# Patient Record
Sex: Female | Born: 1955 | Smoking: Former smoker
Health system: Southern US, Community
[De-identification: ages and names within clinical notes are randomized; demographics above are authoritative.]

## PROBLEM LIST (undated history)

## (undated) DIAGNOSIS — J449 Chronic obstructive pulmonary disease, unspecified: Secondary | ICD-10-CM

## (undated) HISTORY — PX: CHOLECYSTECTOMY: SHX55

---

## 2018-05-03 ENCOUNTER — Encounter (HOSPITAL_COMMUNITY): Payer: Self-pay | Admitting: Internal Medicine

## 2018-05-03 ENCOUNTER — Inpatient Hospital Stay (HOSPITAL_COMMUNITY)
Admission: AD | Admit: 2018-05-03 | Discharge: 2018-05-07 | DRG: 492 | Disposition: A | Discharge status: Skilled Nursing Facility | Payer: Medicare PPO | Source: Other Acute Inpatient Hospital | Attending: Internal Medicine | Admitting: Internal Medicine

## 2018-05-03 ENCOUNTER — Other Ambulatory Visit: Payer: Self-pay | Admitting: Orthopedic Surgery

## 2018-05-03 DIAGNOSIS — Z419 Encounter for procedure for purposes other than remedying health state, unspecified: Secondary | ICD-10-CM

## 2018-05-03 DIAGNOSIS — W19XXXA Unspecified fall, initial encounter: Secondary | ICD-10-CM | POA: Diagnosis not present

## 2018-05-03 DIAGNOSIS — S92341A Displaced fracture of fourth metatarsal bone, right foot, initial encounter for closed fracture: Secondary | ICD-10-CM | POA: Diagnosis present

## 2018-05-03 DIAGNOSIS — G4700 Insomnia, unspecified: Secondary | ICD-10-CM | POA: Diagnosis present

## 2018-05-03 DIAGNOSIS — F419 Anxiety disorder, unspecified: Secondary | ICD-10-CM | POA: Diagnosis present

## 2018-05-03 DIAGNOSIS — G9341 Metabolic encephalopathy: Secondary | ICD-10-CM | POA: Diagnosis present

## 2018-05-03 DIAGNOSIS — R0902 Hypoxemia: Secondary | ICD-10-CM | POA: Diagnosis not present

## 2018-05-03 DIAGNOSIS — S82402A Unspecified fracture of shaft of left fibula, initial encounter for closed fracture: Secondary | ICD-10-CM

## 2018-05-03 DIAGNOSIS — W1811XA Fall from or off toilet without subsequent striking against object, initial encounter: Secondary | ICD-10-CM | POA: Diagnosis present

## 2018-05-03 DIAGNOSIS — J431 Panlobular emphysema: Secondary | ICD-10-CM | POA: Diagnosis not present

## 2018-05-03 DIAGNOSIS — J441 Chronic obstructive pulmonary disease with (acute) exacerbation: Secondary | ICD-10-CM | POA: Diagnosis present

## 2018-05-03 DIAGNOSIS — Z823 Family history of stroke: Secondary | ICD-10-CM | POA: Diagnosis not present

## 2018-05-03 DIAGNOSIS — S82302A Unspecified fracture of lower end of left tibia, initial encounter for closed fracture: Secondary | ICD-10-CM | POA: Diagnosis present

## 2018-05-03 DIAGNOSIS — F329 Major depressive disorder, single episode, unspecified: Secondary | ICD-10-CM | POA: Diagnosis present

## 2018-05-03 DIAGNOSIS — S82252A Displaced comminuted fracture of shaft of left tibia, initial encounter for closed fracture: Principal | ICD-10-CM | POA: Diagnosis present

## 2018-05-03 DIAGNOSIS — J9621 Acute and chronic respiratory failure with hypoxia: Secondary | ICD-10-CM | POA: Diagnosis present

## 2018-05-03 DIAGNOSIS — J449 Chronic obstructive pulmonary disease, unspecified: Secondary | ICD-10-CM | POA: Diagnosis present

## 2018-05-03 DIAGNOSIS — E039 Hypothyroidism, unspecified: Secondary | ICD-10-CM | POA: Diagnosis present

## 2018-05-03 DIAGNOSIS — S82202A Unspecified fracture of shaft of left tibia, initial encounter for closed fracture: Secondary | ICD-10-CM | POA: Diagnosis not present

## 2018-05-03 DIAGNOSIS — S82452A Displaced comminuted fracture of shaft of left fibula, initial encounter for closed fracture: Secondary | ICD-10-CM | POA: Diagnosis present

## 2018-05-03 DIAGNOSIS — Z87891 Personal history of nicotine dependence: Secondary | ICD-10-CM

## 2018-05-03 DIAGNOSIS — D62 Acute posthemorrhagic anemia: Secondary | ICD-10-CM | POA: Diagnosis present

## 2018-05-03 DIAGNOSIS — R52 Pain, unspecified: Secondary | ICD-10-CM

## 2018-05-03 DIAGNOSIS — M79671 Pain in right foot: Secondary | ICD-10-CM

## 2018-05-03 HISTORY — DX: Chronic obstructive pulmonary disease, unspecified: J44.9

## 2018-05-03 LAB — PROTIME-INR
INR: 1.01
PROTHROMBIN TIME: 13.2 s (ref 11.4–15.2)

## 2018-05-03 LAB — TYPE AND SCREEN
ABO/RH(D): AB POS
Antibody Screen: NEGATIVE

## 2018-05-03 LAB — APTT: APTT: 32 s (ref 24–36)

## 2018-05-03 LAB — ABO/RH: ABO/RH(D): AB POS

## 2018-05-03 MED ORDER — IPRATROPIUM-ALBUTEROL 0.5-2.5 (3) MG/3ML IN SOLN
3.0000 mL | Freq: Four times a day (QID) | RESPIRATORY_TRACT | Status: DC
  Administered 2018-05-04 – 2018-05-05 (×4): 3 mL via RESPIRATORY_TRACT
  Filled 2018-05-03 (×5): qty 3

## 2018-05-03 MED ORDER — OXYCODONE-ACETAMINOPHEN 5-325 MG PO TABS
1.0000 | ORAL_TABLET | Freq: Four times a day (QID) | ORAL | Status: DC | PRN
  Administered 2018-05-03 – 2018-05-04 (×2): 1 via ORAL
  Filled 2018-05-03 (×2): qty 1

## 2018-05-03 MED ORDER — METHYLPREDNISOLONE SODIUM SUCC 125 MG IJ SOLR
60.0000 mg | Freq: Three times a day (TID) | INTRAMUSCULAR | Status: DC
  Administered 2018-05-03: 60 mg via INTRAVENOUS
  Filled 2018-05-03: qty 2

## 2018-05-03 MED ORDER — MORPHINE SULFATE (PF) 2 MG/ML IV SOLN
0.5000 mg | INTRAVENOUS | Status: DC | PRN
  Administered 2018-05-04 (×2): 0.5 mg via INTRAVENOUS
  Filled 2018-05-03 (×2): qty 1

## 2018-05-03 MED ORDER — ALBUTEROL SULFATE (2.5 MG/3ML) 0.083% IN NEBU
2.5000 mg | INHALATION_SOLUTION | RESPIRATORY_TRACT | Status: DC | PRN
  Administered 2018-05-06 (×2): 2.5 mg via RESPIRATORY_TRACT
  Filled 2018-05-03 (×2): qty 3

## 2018-05-03 MED ORDER — IPRATROPIUM-ALBUTEROL 0.5-2.5 (3) MG/3ML IN SOLN
3.0000 mL | RESPIRATORY_TRACT | Status: DC
  Administered 2018-05-03: 3 mL via RESPIRATORY_TRACT
  Filled 2018-05-03: qty 3

## 2018-05-03 MED ORDER — SENNOSIDES-DOCUSATE SODIUM 8.6-50 MG PO TABS: 1.0000 | ORAL_TABLET | Freq: Every evening | ORAL | Status: DC | PRN

## 2018-05-03 MED ORDER — METHOCARBAMOL 500 MG PO TABS
500.0000 mg | ORAL_TABLET | Freq: Three times a day (TID) | ORAL | Status: DC | PRN
  Administered 2018-05-04 – 2018-05-07 (×4): 500 mg via ORAL
  Filled 2018-05-03 (×4): qty 1

## 2018-05-03 MED ORDER — DM-GUAIFENESIN ER 30-600 MG PO TB12
1.0000 | ORAL_TABLET | Freq: Two times a day (BID) | ORAL | Status: DC | PRN
  Administered 2018-05-07: 1 via ORAL
  Filled 2018-05-03 (×2): qty 1

## 2018-05-03 NOTE — H&P (Signed)
History and Physical    Rhonda Martinez UJW:[id number] DOB: 01-21-1956 DOA: 05/03/2018  Referring MD/NP/PA:   PCP: No primary care provider on file.   Patient coming from:  The patient is coming from home.  At baseline, pt is independent for most of ADL.   Chief Complaint: fall and left leg pain  HPI: Rhonda Martinez is a 62 y.o. female with medical history significant of COPD, former smoker, who presents with fall and left leg pain.  Patient is transfered from [facility] due to fracture of left distal tibia and proximal fibular diaphysis.   Pt states that she accidentally slipped and fell off the toilet in the early AM, developed severe pain in left lowe leg. Pt was initially seen in the ED of [facility]. Images showed mildly comminuted and minimally displaced proximal fibular diaphysis fracture with mild apex lateral angulation and lateral displacement, and comminuted angulated fracture of the distal tibia. Pt was evaluated by orthopedic surgeon, Dr. Ulice Brilliant there, and he recommendedpatient would benefit to be evaluated by orthopedic at a bigger center or this could also be done outpatient.   In the mean time, pt was noted to have confusion and drowssiness. She was also found to be hypoxia likely secondary to getting IV pain medications including 4 mg of IV morphine 1 mg of IV Dilaudid. Ct-head and MRI of brain were done there, which are all negative for acute intracranial issues. ABG showed pH 7.333, PCO2 51 and PO2 51. Pt was put on splint.  X-ray of her abdomen showed no evidence of bowel obstruction or ileus.  Chest x-ray showed poor inspiration with minimal patchy and linear atelectasis at the right lung base and stable mild chronic interstitial lung disease.  Patient was evaluate by neurology who thought this was either concussion related or metabolic encephalopathy. Dr Phylis Bougie (ED physician) consulted ortho, Dr Tinnie Gens of [facility] hospital.  When I saw pt on the floor, patient is  drowsy, but alert, oriented x3.  She states that she has a severe pain in the left lower leg, which is constant, sharp, 10 out of 10 in severity.  She said she did not have head injury or loss of consciousness when she fell.  She states that she has dry cough, but does not feel shortness of breath.  No chest pain, fever or chills.  No nausea, vomiting, diarrhea, abdominal pain.  Denies symptoms of UTI or unilateral weakness.  No facial droop or slurred speech.  Lbs: WBC 8.0, hemoglobin 12.9, platelets 155, electrolytes renal function okay, lactic acid 1.1, procalcitonin less than 0.05. Negative UA.   Review of Systems:   General: no fevers, chills, no body weight gain, has fatigue HEENT: no blurry vision, hearing changes or sore throat Respiratory: no dyspnea, has coughing, wheezing CV: no chest pain, no palpitations GI: no nausea, vomiting, abdominal pain, diarrhea, constipation GU: no dysuria, burning on urination, increased urinary frequency, hematuria  Ext: no leg edema. Neuro: no unilateral weakness, numbness, or tingling, no vision change or hearing loss. Has confusion and fall Skin: no rash, no skin tear. MSK: has pain in left leg Heme: No easy bruising.  Travel history: No recent long distant travel.  Allergy: Allergies not on file  Past Medical History:  Diagnosis Date  . COPD (chronic obstructive pulmonary disease) (HCC)     Past Surgical History:  Procedure Laterality Date  . CHOLECYSTECTOMY      Social History:  reports that she has quit smoking. She has never used  smokeless tobacco. She reports that she does not drink alcohol or use drugs.  Family History:  Family History  Problem Relation Age of Onset  . Stroke Father      Prior to Admission medications   Not on File    Physical Exam: Vitals:   05/03/18 1932 05/03/18 2040  BP: 139/72   Pulse: 87   Resp: 16   Temp: 98.2 F (36.8 C)   TempSrc: Oral   SpO2: 95% 95%   General: Not in acute  distress HEENT:       Eyes: PERRL, EOMI, no scleral icterus.       ENT: No discharge from the ears and nose, no pharynx injection, no tonsillar enlargement.        Neck: No JVD, no bruit, no mass felt. Heme: No neck lymph node enlargement. Cardiac: S1/S2, RRR, No murmurs, No gallops or rubs. Respiratory: has wheezing bilaterally.   GI: Soft, nondistended, nontender, no rebound pain, no organomegaly, BS present. GU: No hematuria Ext: No pitting leg edema bilaterally. 2+DP/PT pulse bilaterally. Musculoskeletal: pt's left leg is wrapped up (s/p of splint placement per report), has tenderness in the whole left leg. Skin: No rashes.  Neuro: Alert, drowsy, but oriented X3, cranial nerves II-XII grossly intact, moves all extremities. Psych: Patient is not psychotic, no suicidal or hemocidal ideation.  Labs on Admission: I have personally reviewed following labs and imaging studies  CBC: No results for input(s): WBC, NEUTROABS, HGB, HCT, MCV, PLT in the last 168 hours. Basic Metabolic Panel: No results for input(s): NA, K, CL, CO2, GLUCOSE, BUN, CREATININE, CALCIUM, MG, PHOS in the last 168 hours. GFR: CrCl cannot be calculated (No successful lab value found.). Liver Function Tests: No results for input(s): AST, ALT, ALKPHOS, BILITOT, PROT, ALBUMIN in the last 168 hours. No results for input(s): LIPASE, AMYLASE in the last 168 hours. No results for input(s): AMMONIA in the last 168 hours. Coagulation Profile: Recent Labs  Lab 05/03/18 1951  INR 1.01   Cardiac Enzymes: No results for input(s): CKTOTAL, CKMB, CKMBINDEX, TROPONINI in the last 168 hours. BNP (last 3 results) No results for input(s): PROBNP in the last 8760 hours. HbA1C: No results for input(s): HGBA1C in the last 72 hours. CBG: No results for input(s): GLUCAP in the last 168 hours. Lipid Profile: No results for input(s): CHOL, HDL, LDLCALC, TRIG, CHOLHDL, LDLDIRECT in the last 72 hours. Thyroid Function Tests: No  results for input(s): TSH, T4TOTAL, FREET4, T3FREE, THYROIDAB in the last 72 hours. Anemia Panel: No results for input(s): VITAMINB12, FOLATE, FERRITIN, TIBC, IRON, RETICCTPCT in the last 72 hours. Urine analysis: No results found for: COLORURINE, APPEARANCEUR, LABSPEC, PHURINE, GLUCOSEU, HGBUR, BILIRUBINUR, KETONESUR, PROTEINUR, UROBILINOGEN, NITRITE, LEUKOCYTESUR Sepsis Labs: @LABRCNTIP (procalcitonin:4,lacticidven:4) )No results found for this or any previous visit (from the past 240 hour(s)).   Radiological Exams on Admission: No results found.   EKG:  Not done in ED, will get one.   Assessment/Plan Principal Problem:   Closed fracture of left fibula and tibia Active Problems:   Closed fracture of left distal tibia   Fall   COPD exacerbation (HCC)   Acute on chronic respiratory failure with hypoxia (HCC)   Acute metabolic encephalopathy   Closed fracture of left fibula and tibia: EDP in [facility] consulted Dr. Leotis Shames of ortho. At pt's arrival, RN informed ortho of pt's arrival. Per RN, Dr. Evonnie Dawes is going to do the surgery in early AM. Pt still has severe pain. Due to drowsiness, will carefully use narcotics,  particularly IV narcotics.  - will admit to Med-surg bed as inpt - Pain control: low dose morphine (0.5 mg q3h) prn and prn percocet - When necessary Zofran for nausea - Robaxin for muscle spasm - type and cross - INR/PTT - PT/OT when able to (not ordered now) - NPO after MN  Fall: seems to be a mechanica fall. MRI-brain and CT-head negative.  -pt/ot when able to   Acute on chronic respiratory failure with hypoxia and COPD exacerbation: Patient had hypoxia.  Currently oxygen saturation 95% on 2 L nasal cannula oxygen. She has wheezing on auscultation, indicating COPD exacerbation.  No productive cough, fever or leukocytosis, does not need antibiotics. -Nebulizers: scheduled Duoneb and prn albuterol -Solu-Medrol 60 mg IV tid -Mucinex for cough  -Incentive  spirometry -Nasal cannula oxygen as needed to maintain O2 saturation 92% or greater  Acute metabolic encephalopathy: Etiology is not clear.  CT head and MRI for brain negative for acute intracranial abnormalities. patient was evaluate by neurology who thought this was either concussion related or metabolic encephalopathy.  Other differential diagnosis includes hypoxia, delirium and severe pain.  No focal neurologic findings on physical examination.  Urinalysis was negative. -Frequent neuro check -Pain control.   DVT ppx: SCD Code Status: Full code Family Communication: None at bed side.      Disposition Plan:  To be determined, possible rehab Consults called:  Ortho, Dr. Leotis Shames Admission status:   Inpatient/tele      Date of Service 05/03/2018    Lorretta Harp [facility] Pager [phone]  If 7PM-7AM, please contact night-coverage [email or web address] Password [facility] 05/03/2018, 9:42 PM

## 2018-05-03 NOTE — Plan of Care (Signed)
  Problem: Education: Goal: Knowledge of General Education information will improve Description: Including pain rating scale, medication(s)/side effects and non-pharmacologic comfort measures Outcome: Progressing   Problem: Pain Managment: Goal: General experience of comfort will improve Outcome: Progressing   Problem: Safety: Goal: Ability to remain free from injury will improve Outcome: Progressing   

## 2018-05-03 NOTE — Progress Notes (Addendum)
Patient admitted from [facility] through [place]. Pt A&O x3 (person, place, situation.) LLE with short leg splint dry & intact, left toes warm and mobile.  Came in on 4L oxygen nasal cannula and is currently on 3L with O2 sats at 93. No shortness of breath & no chest pain.

## 2018-05-04 ENCOUNTER — Encounter (HOSPITAL_COMMUNITY): Payer: Self-pay | Admitting: Certified Registered Nurse Anesthetist

## 2018-05-04 ENCOUNTER — Encounter (HOSPITAL_COMMUNITY)
Admission: AD | Disposition: A | Discharge status: Skilled Nursing Facility | Payer: Self-pay | Source: Other Acute Inpatient Hospital | Attending: Nephrology

## 2018-05-04 ENCOUNTER — Inpatient Hospital Stay (HOSPITAL_COMMUNITY): Payer: Medicare PPO | Admitting: Anesthesiology

## 2018-05-04 ENCOUNTER — Inpatient Hospital Stay (HOSPITAL_COMMUNITY): Admission: RE | Admit: 2018-05-04 | Payer: Medicare PPO | Source: Ambulatory Visit | Admitting: Orthopedic Surgery

## 2018-05-04 ENCOUNTER — Inpatient Hospital Stay (HOSPITAL_COMMUNITY): Payer: Medicare PPO

## 2018-05-04 DIAGNOSIS — Z419 Encounter for procedure for purposes other than remedying health state, unspecified: Secondary | ICD-10-CM

## 2018-05-04 HISTORY — PX: TIBIA IM NAIL INSERTION: SHX2516

## 2018-05-04 LAB — HIV ANTIBODY (ROUTINE TESTING W REFLEX): HIV Screen 4th Generation wRfx: NONREACTIVE

## 2018-05-04 LAB — BASIC METABOLIC PANEL
Anion gap: 9 (ref 5–15)
BUN: 9 mg/dL (ref 8–23)
CO2: 25 mmol/L (ref 22–32)
Calcium: 9 mg/dL (ref 8.9–10.3)
Chloride: 103 mmol/L (ref 98–111)
Creatinine, Ser: 0.97 mg/dL (ref 0.44–1.00)
GFR calc Af Amer: >60 mL/min (ref >60)
GLUCOSE: 146 mg/dL — AB (ref 70–99)
POTASSIUM: 4.3 mmol/L (ref 3.5–5.1)
Sodium: 137 mmol/L (ref 135–145)

## 2018-05-04 LAB — CBC
HCT: 35.2 % — ABNORMAL LOW (ref 36.0–46.0)
Hemoglobin: 11.3 g/dL — ABNORMAL LOW (ref 12.0–15.0)
MCH: 32.6 pg (ref 26.0–34.0)
MCHC: 32.1 g/dL (ref 30.0–36.0)
MCV: 101.4 fL — AB (ref 78.0–100.0)
PLATELETS: 174 10*3/uL (ref 150–400)
RBC: 3.47 MIL/uL — AB (ref 3.87–5.11)
RDW: 14.5 % (ref 11.5–15.5)
WBC: 9.3 10*3/uL (ref 4.0–10.5)

## 2018-05-04 LAB — SURGICAL PCR SCREEN
MRSA, PCR: NEGATIVE
Staphylococcus aureus: NEGATIVE

## 2018-05-04 SURGERY — INSERTION, INTRAMEDULLARY ROD, TIBIA
Anesthesia: General | Laterality: Left

## 2018-05-04 MED ORDER — FENTANYL CITRATE (PF) 250 MCG/5ML IJ SOLN
INTRAMUSCULAR | Status: AC
  Filled 2018-05-04: qty 5

## 2018-05-04 MED ORDER — ROCURONIUM BROMIDE 50 MG/5ML IV SOSY
PREFILLED_SYRINGE | INTRAVENOUS | Status: DC | PRN
  Administered 2018-05-04: 25 mg via INTRAVENOUS
  Administered 2018-05-04: 50 mg via INTRAVENOUS
  Administered 2018-05-04: 10 mg via INTRAVENOUS

## 2018-05-04 MED ORDER — ADULT MULTIVITAMIN W/MINERALS CH
1.0000 | ORAL_TABLET | Freq: Every day | ORAL | Status: DC
  Administered 2018-05-05 – 2018-05-07 (×3): 1 via ORAL
  Filled 2018-05-04 (×3): qty 1

## 2018-05-04 MED ORDER — FENTANYL CITRATE (PF) 100 MCG/2ML IJ SOLN
INTRAMUSCULAR | Status: DC | PRN
  Administered 2018-05-04 (×7): 50 ug via INTRAVENOUS

## 2018-05-04 MED ORDER — SUGAMMADEX SODIUM 200 MG/2ML IV SOLN
INTRAVENOUS | Status: DC | PRN
  Administered 2018-05-04: 200 mg via INTRAVENOUS

## 2018-05-04 MED ORDER — PHENYLEPHRINE 40 MCG/ML (10ML) SYRINGE FOR IV PUSH (FOR BLOOD PRESSURE SUPPORT)
PREFILLED_SYRINGE | INTRAVENOUS | Status: AC
  Filled 2018-05-04: qty 10

## 2018-05-04 MED ORDER — PROMETHAZINE HCL 25 MG/ML IJ SOLN: 6.2500 mg | INTRAMUSCULAR | Status: DC | PRN

## 2018-05-04 MED ORDER — ENSURE ENLIVE PO LIQD
237.0000 mL | Freq: Two times a day (BID) | ORAL | Status: DC
  Administered 2018-05-05 – 2018-05-07 (×6): 237 mL via ORAL

## 2018-05-04 MED ORDER — ONDANSETRON HCL 4 MG/2ML IJ SOLN
INTRAMUSCULAR | Status: AC
  Filled 2018-05-04: qty 2

## 2018-05-04 MED ORDER — PROPOFOL 10 MG/ML IV BOLUS
INTRAVENOUS | Status: DC | PRN
  Administered 2018-05-04: 100 mg via INTRAVENOUS

## 2018-05-04 MED ORDER — HYDROMORPHONE HCL 1 MG/ML IJ SOLN
INTRAMUSCULAR | Status: AC
  Administered 2018-05-04: 0.5 mg via INTRAVENOUS
  Filled 2018-05-04: qty 1

## 2018-05-04 MED ORDER — ENOXAPARIN SODIUM 40 MG/0.4ML ~~LOC~~ SOLN
40.0000 mg | SUBCUTANEOUS | Status: DC
  Administered 2018-05-05 – 2018-05-07 (×3): 40 mg via SUBCUTANEOUS
  Filled 2018-05-04 (×3): qty 0.4

## 2018-05-04 MED ORDER — LACTATED RINGERS IV SOLN
INTRAVENOUS | Status: DC | PRN
  Administered 2018-05-04 (×2): via INTRAVENOUS

## 2018-05-04 MED ORDER — ACETAMINOPHEN 10 MG/ML IV SOLN
INTRAVENOUS | Status: AC
  Filled 2018-05-04: qty 100

## 2018-05-04 MED ORDER — CEFAZOLIN SODIUM-DEXTROSE 2-3 GM-%(50ML) IV SOLR
INTRAVENOUS | Status: DC | PRN
  Administered 2018-05-04: 2 g via INTRAVENOUS

## 2018-05-04 MED ORDER — 0.9 % SODIUM CHLORIDE (POUR BTL) OPTIME
TOPICAL | Status: DC | PRN
  Administered 2018-05-04: 1000 mL

## 2018-05-04 MED ORDER — DULOXETINE HCL 60 MG PO CPEP
60.0000 mg | ORAL_CAPSULE | Freq: Two times a day (BID) | ORAL | Status: DC
  Administered 2018-05-05 – 2018-05-07 (×6): 60 mg via ORAL
  Filled 2018-05-04 (×7): qty 1

## 2018-05-04 MED ORDER — QUETIAPINE FUMARATE 400 MG PO TABS
200.0000 mg | ORAL_TABLET | Freq: Every day | ORAL | Status: DC
  Administered 2018-05-05 – 2018-05-07 (×3): 200 mg via ORAL
  Filled 2018-05-04 (×3): qty 1

## 2018-05-04 MED ORDER — ACETAMINOPHEN 10 MG/ML IV SOLN
INTRAVENOUS | Status: DC | PRN
  Administered 2018-05-04: 1000 mg via INTRAVENOUS

## 2018-05-04 MED ORDER — METHYLPREDNISOLONE SODIUM SUCC 125 MG IJ SOLR
40.0000 mg | Freq: Two times a day (BID) | INTRAMUSCULAR | Status: DC
  Administered 2018-05-04 – 2018-05-05 (×2): 40 mg via INTRAVENOUS
  Filled 2018-05-04 (×2): qty 2

## 2018-05-04 MED ORDER — DEXAMETHASONE SODIUM PHOSPHATE 10 MG/ML IJ SOLN
INTRAMUSCULAR | Status: DC | PRN
  Administered 2018-05-04: 10 mg via INTRAVENOUS

## 2018-05-04 MED ORDER — LIDOCAINE 2% (20 MG/ML) 5 ML SYRINGE
INTRAMUSCULAR | Status: DC | PRN
  Administered 2018-05-04: 60 mg via INTRAVENOUS

## 2018-05-04 MED ORDER — OXYCODONE-ACETAMINOPHEN 5-325 MG PO TABS
1.0000 | ORAL_TABLET | Freq: Four times a day (QID) | ORAL | Status: DC | PRN
  Administered 2018-05-04 – 2018-05-07 (×10): 2 via ORAL
  Filled 2018-05-04: qty 1
  Filled 2018-05-04 (×2): qty 2
  Filled 2018-05-04: qty 1
  Filled 2018-05-04: qty 2
  Filled 2018-05-04 (×2): qty 1
  Filled 2018-05-04 (×5): qty 2

## 2018-05-04 MED ORDER — ALBUTEROL SULFATE HFA 108 (90 BASE) MCG/ACT IN AERS
INHALATION_SPRAY | RESPIRATORY_TRACT | Status: DC | PRN
  Administered 2018-05-04: 2 via RESPIRATORY_TRACT
  Administered 2018-05-04: 4 via RESPIRATORY_TRACT

## 2018-05-04 MED ORDER — ONDANSETRON HCL 4 MG/2ML IJ SOLN
INTRAMUSCULAR | Status: DC | PRN
  Administered 2018-05-04: 4 mg via INTRAVENOUS

## 2018-05-04 MED ORDER — MIDAZOLAM HCL 5 MG/5ML IJ SOLN
INTRAMUSCULAR | Status: DC | PRN
  Administered 2018-05-04: 2 mg via INTRAVENOUS

## 2018-05-04 MED ORDER — MORPHINE SULFATE (PF) 2 MG/ML IV SOLN
1.0000 mg | INTRAVENOUS | Status: DC | PRN
  Administered 2018-05-04 – 2018-05-05 (×2): 1 mg via INTRAVENOUS
  Administered 2018-05-06: 2 mg via INTRAVENOUS
  Filled 2018-05-04 (×3): qty 1

## 2018-05-04 MED ORDER — QUETIAPINE FUMARATE 400 MG PO TABS
400.0000 mg | ORAL_TABLET | Freq: Every day | ORAL | Status: DC
  Administered 2018-05-04 – 2018-05-07 (×4): 400 mg via ORAL
  Filled 2018-05-04 (×4): qty 1

## 2018-05-04 MED ORDER — HYDROMORPHONE HCL 1 MG/ML IJ SOLN
0.2500 mg | INTRAMUSCULAR | Status: DC | PRN
  Administered 2018-05-04: 0.5 mg via INTRAVENOUS

## 2018-05-04 MED ORDER — MIDAZOLAM HCL 2 MG/2ML IJ SOLN
INTRAMUSCULAR | Status: AC
  Filled 2018-05-04: qty 2

## 2018-05-04 MED ORDER — LEVALBUTEROL HCL 1.25 MG/0.5ML IN NEBU
1.2500 mg | INHALATION_SOLUTION | Freq: Once | RESPIRATORY_TRACT | Status: AC
  Administered 2018-05-04: 1.25 mg via RESPIRATORY_TRACT
  Filled 2018-05-04: qty 0.5

## 2018-05-04 SURGICAL SUPPLY — 71 items
BANDAGE ACE 4X5 VEL STRL LF (GAUZE/BANDAGES/DRESSINGS) ×3 IMPLANT
BANDAGE ACE 6X5 VEL STRL LF (GAUZE/BANDAGES/DRESSINGS) ×3 IMPLANT
BANDAGE ESMARK 6X9 LF (GAUZE/BANDAGES/DRESSINGS) IMPLANT
BIT DRILL 4.3 CALIBRATED (DRILL) ×1 IMPLANT
BIT DRILL CALIBRTD FREE HND4.3 (BIT) ×1 IMPLANT
BLADE CLIPPER SURG (BLADE) IMPLANT
BLADE SURG 15 STRL LF DISP TIS (BLADE) ×1 IMPLANT
BLADE SURG 15 STRL SS (BLADE) ×2
BNDG COHESIVE 6X5 TAN STRL LF (GAUZE/BANDAGES/DRESSINGS) ×3 IMPLANT
BNDG ESMARK 6X9 LF (GAUZE/BANDAGES/DRESSINGS)
BNDG GAUZE ELAST 4 BULKY (GAUZE/BANDAGES/DRESSINGS) ×3 IMPLANT
BOOTCOVER CLEANROOM LRG (PROTECTIVE WEAR) ×6 IMPLANT
COVER SURGICAL LIGHT HANDLE (MISCELLANEOUS) ×6 IMPLANT
CUFF TOURNIQUET SINGLE 34IN LL (TOURNIQUET CUFF) IMPLANT
CUFF TOURNIQUET SINGLE 44IN (TOURNIQUET CUFF) IMPLANT
DRAPE C-ARM 42X72 X-RAY (DRAPES) ×3 IMPLANT
DRAPE HALF SHEET 40X57 (DRAPES) ×6 IMPLANT
DRAPE IMP U-DRAPE 54X76 (DRAPES) ×3 IMPLANT
DRAPE INCISE IOBAN 66X45 STRL (DRAPES) ×3 IMPLANT
DRAPE ORTHO SPLIT 77X108 STRL (DRAPES) ×6
DRAPE SURG ORHT 6 SPLT 77X108 (DRAPES) ×3 IMPLANT
DRAPE U-SHAPE 47X51 STRL (DRAPES) ×3 IMPLANT
DRILL 4.3 CALIBRATED (DRILL) ×3
DRILL CALIBRATED FREE HAND 4.3 (BIT) ×3
DRSG ADAPTIC 3X8 NADH LF (GAUZE/BANDAGES/DRESSINGS) ×3 IMPLANT
DRSG PAD ABDOMINAL 8X10 ST (GAUZE/BANDAGES/DRESSINGS) ×3 IMPLANT
DURAPREP 26ML APPLICATOR (WOUND CARE) ×3 IMPLANT
ELECT REM PT RETURN 9FT ADLT (ELECTROSURGICAL) ×3
ELECTRODE REM PT RTRN 9FT ADLT (ELECTROSURGICAL) ×1 IMPLANT
FACESHIELD WRAPAROUND (MASK) IMPLANT
GAUZE SPONGE 4X4 12PLY STRL (GAUZE/BANDAGES/DRESSINGS) ×3 IMPLANT
GAUZE XEROFORM 5X9 LF (GAUZE/BANDAGES/DRESSINGS) ×3 IMPLANT
GLOVE BIO SURGEON STRL SZ7.5 (GLOVE) ×3 IMPLANT
GLOVE BIOGEL PI IND STRL 8 (GLOVE) ×1 IMPLANT
GLOVE BIOGEL PI INDICATOR 8 (GLOVE) ×2
GLOVE BIOGEL PI ORTHO PRO SZ8 (GLOVE) ×2
GLOVE ORTHO TXT STRL SZ7.5 (GLOVE) ×3 IMPLANT
GLOVE PI ORTHO PRO STRL SZ8 (GLOVE) ×1 IMPLANT
GLOVE SURG ORTHO 8.0 STRL STRW (GLOVE) ×6 IMPLANT
GOWN STRL REUS W/ TWL LRG LVL3 (GOWN DISPOSABLE) IMPLANT
GOWN STRL REUS W/TWL LRG LVL3 (GOWN DISPOSABLE)
GUIDEWIRE BALL NOSE 100CM (WIRE) ×3 IMPLANT
KIT BASIN OR (CUSTOM PROCEDURE TRAY) ×3 IMPLANT
KIT TURNOVER KIT B (KITS) ×3 IMPLANT
MANIFOLD NEPTUNE II (INSTRUMENTS) ×3 IMPLANT
NAIL TIBIAL ZNN 10MMX32CM (Nail) ×3 IMPLANT
NS IRRIG 1000ML POUR BTL (IV SOLUTION) ×3 IMPLANT
PACK GENERAL/GYN (CUSTOM PROCEDURE TRAY) ×3 IMPLANT
PACK UNIVERSAL I (CUSTOM PROCEDURE TRAY) ×3 IMPLANT
PAD ARMBOARD 7.5X6 YLW CONV (MISCELLANEOUS) ×6 IMPLANT
PAD CAST 4YDX4 CTTN HI CHSV (CAST SUPPLIES) ×1 IMPLANT
PADDING CAST COTTON 4X4 STRL (CAST SUPPLIES) ×2
PIN GUIDE 3.0 THREADED (PIN) ×3 IMPLANT
SCREW BONE 5.0X35MM CORTICAL (Screw) ×3 IMPLANT
SCREW BONE 5.0X37.5MM CORT Z (Screw) ×3 IMPLANT
SCREW CANC FEM FA 5X30 (Screw) ×6 IMPLANT
SCREW HEX HEAD 3.5X42.5 (Screw) ×3 IMPLANT
STAPLER VISISTAT 35W (STAPLE) ×3 IMPLANT
STOCKINETTE IMPERVIOUS LG (DRAPES) ×3 IMPLANT
SUT ETHILON 2 0 FS 18 (SUTURE) ×3 IMPLANT
SUT MNCRL AB 3-0 PS2 18 (SUTURE) ×3 IMPLANT
SUT PDS AB 0 CT 36 (SUTURE) ×3 IMPLANT
SUT VIC AB 0 CT1 18XCR BRD 8 (SUTURE) ×1 IMPLANT
SUT VIC AB 0 CT1 8-18 (SUTURE) ×2
SUT VIC AB 2-0 CT1 27 (SUTURE) ×2
SUT VIC AB 2-0 CT1 TAPERPNT 27 (SUTURE) ×1 IMPLANT
SUT VIC AB 3-0 SH 8-18 (SUTURE) ×3 IMPLANT
TOWEL OR 17X24 6PK STRL BLUE (TOWEL DISPOSABLE) ×3 IMPLANT
TOWEL OR 17X26 10 PK STRL BLUE (TOWEL DISPOSABLE) ×3 IMPLANT
TRAY FOLEY MTR SLVR 16FR STAT (SET/KITS/TRAYS/PACK) IMPLANT
WATER STERILE IRR 1000ML POUR (IV SOLUTION) ×3 IMPLANT

## 2018-05-04 NOTE — Progress Notes (Signed)
Orthopedic Tech Progress Note Patient Details:  Rhonda Martinez 21-Dec-1955 [id number]  Ortho Devices Type of Ortho Device: CAM walker Ortho Device/Splint Location: or to apply Ortho Device/Splint Interventions: Casandra Doffing 05/04/2018, 10:13 AM

## 2018-05-04 NOTE — Transfer of Care (Signed)
Immediate Anesthesia Transfer of Care Note  Patient: Rhonda Martinez  Procedure(s) Performed: INTRAMEDULLARY (IM) NAIL TIBIAL (Left )  Patient Location: PACU  Anesthesia Type:General  Level of Consciousness: awake, alert  and oriented  Airway & Oxygen Therapy: Patient Spontanous Breathing and Patient connected to face mask oxygen  Post-op Assessment: Report given to RN and Post -op Vital signs reviewed and stable  Post vital signs: Reviewed and stable  Last Vitals:  Vitals Value Taken Time  BP 131/64 05/04/2018 10:30 AM  Temp    Pulse 110 05/04/2018 10:33 AM  Resp 17 05/04/2018 10:33 AM  SpO2 89 % 05/04/2018 10:33 AM  Vitals shown include unvalidated device data.  Last Pain:  Vitals:   05/04/18 0430  TempSrc: Oral  PainSc:          Complications: No apparent anesthesia complications

## 2018-05-04 NOTE — NC FL2 (Signed)
[place] MEDICAID [id number] LEVEL OF CARE SCREENING TOOL     IDENTIFICATION  Patient Name: Rhonda Martinez Birthdate: 05-29-1956 Sex: female Admission Date (Current Location): 05/03/2018  [facility] and [place] Number:  Producer, television/film/video and Address:  The [place], [address], [place], [place] [address]      Provider Number: [id number]  Attending Physician Name and Address:  Delano Metz, MD  Relative Name and Phone Number:  Cristal Deer son at [phone]    Current Level of Care: Hospital Recommended Level of Care: Skilled Nursing Facility Prior Approval Number:    Date Approved/Denied:   PASRR Number: [id number]  Discharge Plan: SNF    Current Diagnoses: Patient Active Problem List   Diagnosis Date Noted  . Surgery, elective   . Closed fracture of left distal tibia 05/03/2018  . Fall 05/03/2018  . COPD (chronic obstructive pulmonary disease) (HCC) 05/03/2018  . Acute on chronic respiratory failure with hypoxia (HCC) 05/03/2018  . Acute metabolic encephalopathy 05/03/2018  . Closed fracture of left fibula and tibia 05/03/2018    Orientation RESPIRATION BLADDER Height & Weight     Self, Time, Place  Normal Continent Weight: 210 lb 1.6 oz (95.3 kg)(from 03/22/18 encounter) Height:  5' 5.98" (167.6 cm)(from 03/22/18 encounter)  BEHAVIORAL SYMPTOMS/MOOD NEUROLOGICAL BOWEL NUTRITION STATUS      Continent Diet(see discharge summary)  AMBULATORY STATUS COMMUNICATION OF NEEDS Skin   Limited Assist Verbally Surgical wounds(left leg surgical incision)                       Personal Care Assistance Level of Assistance  Bathing, Feeding, Dressing, Total care Bathing Assistance: Limited assistance Feeding assistance: Independent Dressing Assistance: Limited assistance Total Care Assistance: Limited assistance   Functional Limitations Info  Sight, Hearing, Speech Sight Info: Adequate Hearing Info: Adequate Speech Info: Adequate    SPECIAL  CARE FACTORS FREQUENCY  PT (By licensed PT), OT (By licensed OT)     PT Frequency: 5x weekly OT Frequency: 5x weekly            Contractures Contractures Info: Not present    Additional Factors Info  Allergies   Allergies Info: no known           Current Medications (05/04/2018):  This is the current hospital active medication list Current Facility-Administered Medications  Medication Dose Route Frequency Provider Last Rate Last Dose  . albuterol (PROVENTIL) (2.5 MG/3ML) 0.083% nebulizer solution 2.5 mg  2.5 mg Nebulization Q4H PRN Terance Hart, MD      . dextromethorphan-guaiFENesin [facility] DM) 30-600 MG per 12 hr tablet 1 tablet  1 tablet Oral BID PRN Terance Hart, MD      . DULoxetine (CYMBALTA) DR capsule 60 mg  60 mg Oral BID Delano Metz, MD      . Melene Muller ON 05/05/2018] enoxaparin (LOVENOX) injection 40 mg  40 mg Subcutaneous Q24H Terance Hart, MD      . Melene Muller ON 05/05/2018] feeding supplement (ENSURE ENLIVE) (ENSURE ENLIVE) liquid 237 mL  237 mL Oral BID BM Delano Metz, MD      . ipratropium-albuterol (DUONEB) 0.5-2.5 (3) MG/3ML nebulizer solution 3 mL  3 mL Nebulization Q6H Terance Hart, MD   3 mL at 05/04/18 1355  . methocarbamol (ROBAXIN) tablet 500 mg  500 mg Oral Q8H PRN Terance Hart, MD      . methylPREDNISolone sodium succinate (SOLU-MEDROL) 125 mg/2 mL injection 40 mg  40 mg Intravenous  Q12H Delano Metz, MD      . morphine 2 MG/ML injection 1-2 mg  1-2 mg Intravenous Q4H PRN Delano Metz, MD   1 mg at 05/04/18 1543  . [START ON 05/05/2018] multivitamin with minerals tablet 1 tablet  1 tablet Oral Daily Delano Metz, MD      . oxyCODONE-acetaminophen (PERCOCET/ROXICET) 5-325 MG per tablet 1-2 tablet  1-2 tablet Oral Q6H PRN Delano Metz, MD      . Melene Muller ON 05/05/2018] QUEtiapine (SEROQUEL) tablet 200 mg  200 mg Oral Q breakfast Delano Metz, MD      . QUEtiapine (SEROQUEL) tablet 400 mg  400 mg Oral QHS  Delano Metz, MD      . senna-docusate (Senokot-S) tablet 1 tablet  1 tablet Oral QHS PRN Terance Hart, MD         Discharge Medications: Please see discharge summary for a list of discharge medications.  Relevant Imaging Results:  Relevant Lab Results:   Additional Information SSN: [id number]  Gildardo Griffes, LCSW

## 2018-05-04 NOTE — Progress Notes (Signed)
Patient reports she changed her mind and would like to go to SNF in [place] city. CSW asked the name of the facility and patient reported it was called [facility]. Then patient reported she was guessing, but this was her only choice. CSW encouraged her to choose 3 choices if [facility] does not have beds however patient reports this is her only option. CSW will research possible SNF placements in this area.   [place], [place] [phone]

## 2018-05-04 NOTE — Clinical Social Work Note (Signed)
Clinical Social Work Assessment  Patient Details  Name: Rhonda Martinez MRN: [id number] Date of Birth: 1956-06-05  Date of referral:  05/04/18               Reason for consult:  Care Management Concerns, Discharge Planning                Permission sought to share information with:  Case Manager, Facility Medical sales representative Permission granted to share information::  Yes, Verbal Permission Granted  Name::     No family permission to contact at this time  Agency::  SNFs  Relationship::     Contact Information:     Housing/Transportation Living arrangements for the past 2 months:  Single Family Home Source of Information:  Patient Patient Interpreter Needed:  None Criminal Activity/Legal Involvement Pertinent to Current Situation/Hospitalization:  No - Comment as needed Significant Relationships:  Adult Children Lives with:  Adult Children Do you feel safe going back to the place where you live?  No Need for family participation in patient care:  No (Coment)  Care giving concerns:  CSW received referral for possible SNF placement at time of discharge. Spoke with patient regarding possibility of SNF placement . Patient's  son  is currently unable to care for her at their home given patient's current needs and fall risk.  Patient expressed understanding of PT recommendation and is agreeable despite prior hesitations to SNF placement at time of discharge. CSW to continue to follow and assist with discharge planning needs.     Social Worker assessment / plan:  Spoke with patient concerning possibility of rehab at SNF before returning home. Patient denied wanting to go to SNF at first interaction, as she had reported she would go home to her son and her animals (cats and dogs). Patient then changed her mind and now agrees to SNF.   Employment status:  Retired Database administrator PT Recommendations:  Skilled Nursing Facility Information / Referral to community  resources:  Skilled Nursing Facility  Patient/Family's Response to care:  Patient recognizes need for rehab before returning home and are agreeable to a SNF in [place]. They report preference for       . CSW explained insurance authorization process.   Patient/Family's Understanding of and Emotional Response to Diagnosis, Current Treatment, and Prognosis:  Patient is realistic regarding therapy needs and expressed being hopeful for SNF placement. Patient expressed understanding of CSW role and discharge process as well as medical condition. No questions/concerns about plan or treatment.    Emotional Assessment Appearance:  Appears stated age Attitude/Demeanor/Rapport:  Irrational, Engaged, Inconsistent Affect (typically observed):  Apprehensive, Irritable Orientation:  Oriented to Self, Oriented to Place, Oriented to Situation, Oriented to  Time Alcohol / Substance use:  Not Applicable Psych involvement (Current and /or in the community):  No (Comment)  Discharge Needs  Concerns to be addressed:  Discharge Planning Concerns, Care Coordination Readmission within the last 30 days:  No Current discharge risk:  Dependent with Mobility Barriers to Discharge:  Continued Medical Work up   [employer], LCSW 05/04/2018, 4:14 PM

## 2018-05-04 NOTE — Anesthesia Postprocedure Evaluation (Signed)
Anesthesia Post Note  Patient: [name]  Procedure(s) Performed: INTRAMEDULLARY (IM) NAIL TIBIAL (Left )     Patient location during evaluation: PACU Anesthesia Type: General Level of consciousness: awake and alert Pain management: pain level controlled Vital Signs Assessment: post-procedure vital signs reviewed and stable Respiratory status: spontaneous breathing, nonlabored ventilation, respiratory function stable and patient connected to nasal cannula oxygen Cardiovascular status: blood pressure returned to baseline and stable Postop Assessment: no apparent nausea or vomiting Anesthetic complications: no    Last Vitals:  Vitals:   05/04/18 1130 05/04/18 1205  BP: (!) 147/96 132/77  Pulse: 93 85  Resp: (!) 9 16  Temp: (!) 36.2 C 36.9 C  SpO2: 91% 93%    Last Pain:  Vitals:   05/04/18 1205  TempSrc: Oral  PainSc:                  Thresia Ramanathan S

## 2018-05-04 NOTE — Anesthesia Procedure Notes (Addendum)
Procedure Name: Intubation Date/Time: 05/04/2018 7:42 AM Performed by: Inda Coke, CRNA Pre-anesthesia Checklist: Patient identified, Emergency Drugs available, Suction available and Patient being monitored Patient Re-evaluated:Patient Re-evaluated prior to induction Oxygen Delivery Method: Circle System Utilized Preoxygenation: Pre-oxygenation with 100% oxygen Induction Type: IV induction Ventilation: Mask ventilation without difficulty and Oral airway inserted - appropriate to patient size Laryngoscope Size: Mac and 3 Grade View: Grade I Tube type: Oral Number of attempts: 1 Airway Equipment and Method: Stylet and Oral airway Placement Confirmation: ETT inserted through vocal cords under direct vision,  positive ETCO2 and breath sounds checked- equal and bilateral Secured at: 21 cm Tube secured with: Tape Dental Injury: Teeth and Oropharynx as per pre-operative assessment
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
## 2018-05-04 NOTE — Progress Notes (Addendum)
[facility] Progress Note  Subjective: no new c/o, typical post op pain from ortho surg this am.  Breathing better, no SOB or cough.   Vitals:   05/04/18 1130 05/04/18 1205 05/04/18 1355 05/04/18 1433  BP: (!) 147/96 132/77  133/76  Pulse: 93 85  (!) 106  Resp: (!) 9 16  (!) 46  Temp: (!) 97.2 F (36.2 C) 98.5 F (36.9 C)  98.6 F (37 C)  TempSrc:  Oral  Oral  SpO2: 91% 93% 92% 94%  Weight:      Height:        Inpatient medications: . [START ON 05/05/2018] enoxaparin (LOVENOX) injection  40 mg Subcutaneous Q24H  . [START ON 05/05/2018] feeding supplement (ENSURE ENLIVE)  237 mL Oral BID BM  . ipratropium-albuterol  3 mL Nebulization Q6H  . methylPREDNISolone (SOLU-MEDROL) injection  40 mg Intravenous Q12H  . [START ON 05/05/2018] multivitamin with minerals  1 tablet Oral Daily    albuterol, dextromethorphan-guaiFENesin, methocarbamol, morphine injection, oxyCODONE-acetaminophen, senna-docusate  Exam:  alert, no distress, on nasal O2  no jvd  chest CTA bilat, no wheezing or rales  cor reg no mrg  Abd obese soft ntnd no mass or ascites  Ext no edema RLE, L leg wrapped in bandage w/ leg brace in place from mid thigh to ankle  NF, Ox 3, pleasant    Presentation Summary: Rhonda Martinez is a 62 y.o. female with medical history significant of COPD, former smoker, who presents with fall and left leg pain.  Patient was transfered from [facility] due to fracture of left distal tibia and proximal fibular diaphysis.   Pt states that she accidentally slipped and fell off the toilet in the early AM, developed severe pain in left lowe leg. Pt was initially seen in the ED of [facility]. Images showed mildly comminuted and minimally displaced proximal fibular diaphysis fracture with mild apex lateral angulation and lateral displacement, and comminuted angulated fracture of the distal tibia. Pt was evaluated by orthopedic surgeon, Dr. Ulice Brilliant there, and he  recommendedpatient would benefit to be evaluated by orthopedic at a bigger center or this could also be done outpatient.   In the mean time, pt was noted to have confusion and drowssiness. She was also found to be hypoxia likely secondary to getting IV pain medications including 4 mg of IV morphine 1 mg of IV Dilaudid. Ct-head and MRI of brain were done there, which are all negative for acute intracranial issues. ABG showed pH 7.333, PCO2 51 and PO2 51. Pt was put on splint.  X-ray of her abdomen showed no evidence of bowel obstruction or ileus.  Chest x-ray showed poor inspiration with minimal patchy and linear atelectasis at the right lung base and stable mild chronic interstitial lung disease.  Patient was evaluate by neurology who thought this was either concussion related or metabolic encephalopathy. Dr Phylis Bougie (ED physician) consulted ortho, Dr Tinnie Gens of [facility] hospital.  On arrival here patient was drowsy, but alert, oriented x3.  She states that she has a severe pain in the left lower leg, which is constant, sharp, 10 out of 10 in severity.  She said she did not have head injury or loss of consciousness when she fell.  She states that she has dry cough, but does not feel shortness of breath.  No chest pain, fever or chills.  No nausea, vomiting, diarrhea, abdominal pain.  Denies symptoms of UTI or unilateral weakness.  No facial droop or slurred speech.  Labs: WBC 8.0, hemoglobin 12.9, platelets 155, electrolytes renal function okay, lactic acid 1.1, procalcitonin less than 0.05. Negative UA.        Hospital Problems/ Course:  Closed fracture of left fibula and tibia: EDP in [facility] consulted Dr. Leotis Shames of ortho. They recommended pt be transferred, pt was sent to [facility] last night and had surgery today by Dr Susa Simmonds of Orthopedics - is now S/P IM nailing of left tibia fracture done today 05/04/18 - Pain control: low dose narcotics not covering postop pain, will increase IV and po - Robaxin for  muscle spasm - PT/OT when able to (not ordered now) - daily CBC x 3 days to catch any Hb drop  Fall: seems to be a mechanica fall. MRI-brain and CT-head negative.  -pt/ot when able  Acute on chronic respiratory failure with hypoxia and COPD exacerbation: Patient had hypoxia.  Currently oxygen saturation 95% on 2 L nasal cannula oxygen. She was wheezing, likely COPD exac - improving on exam, lungs clear today  - cont Nebulizers: scheduled Duoneb and prn albuterol -lower IV steroids to 40 IV bid -Mucinex for cough  -Incentive spirometry - O2  Anxiety/ depression/ insomnia : 4 medications listed on home list PTA meds - Wellbutrin, Buspar, Cymbalta and Seroquel.   - patient states she is only taking Cymbalta and Seroquel, will resume    Acute metabolic encephalopathy: mostly resolved. CT head and MRI for brain negative for acute intracranial abnormalities. patient was evaluate by neurology who thought this was either concussion related or metabolic encephalopathy.  Other differential diagnosis included hypoxia, delirium and severe pain.  No focal neurologic findings on physical examination.  Urinalysis was negative - Pain control - MS improving, oriented today   DVT ppx: SCD Code Status: Full code Family Communication: None at bed side.      Disposition Plan: To be determined, possible rehab Consults called: Ortho, Dr. Susa Simmonds Admission status: Inpatient/tele      Vinson Moselle MD [facility] pgr [phone] 05/04/2018, 3:23 PM   Recent Labs  Lab 05/04/18 0459  NA 137  K 4.3  CL 103  CO2 25  GLUCOSE 146*  BUN 9  CREATININE 0.97  CALCIUM 9.0   No results for input(s): AST, ALT, ALKPHOS, BILITOT, PROT, ALBUMIN in the last 168 hours. Recent Labs  Lab 05/04/18 0459  WBC 9.3  HGB 11.3*  HCT 35.2*  MCV 101.4*  PLT 174   Iron/TIBC/Ferritin/ %Sat No results found for: IRON, TIBC, FERRITIN, IRONPCTSAT

## 2018-05-04 NOTE — Anesthesia Preprocedure Evaluation (Addendum)
Anesthesia Evaluation  Patient identified by MRN, date of birth, ID band Patient awake    Reviewed: Allergy & Precautions, NPO status , Patient's Chart, lab work & pertinent test results  Airway Mallampati: II  TM Distance: >3 FB Neck ROM: Full    Dental no notable dental hx.    Pulmonary COPD, former smoker,    Pulmonary exam normal breath sounds clear to auscultation       Cardiovascular negative cardio ROS Normal cardiovascular exam Rhythm:Regular Rate:Normal     Neuro/Psych Acute metabolic encephalopathynegative neurological ROS     GI/Hepatic negative GI ROS, Neg liver ROS,   Endo/Other  negative endocrine ROS  Renal/GU negative Renal ROS  negative genitourinary   Musculoskeletal negative musculoskeletal ROS (+)   Abdominal   Peds negative pediatric ROS (+)  Hematology  (+) anemia ,   Anesthesia Other Findings   Reproductive/Obstetrics negative OB ROS                            Anesthesia Physical Anesthesia Plan  ASA: III  Anesthesia Plan: General   Post-op Pain Management:    Induction: Intravenous  PONV Risk Score and Plan: 3 and Ondansetron, Dexamethasone and Treatment may vary due to age or medical condition  Airway Management Planned: Oral ETT  Additional Equipment:   Intra-op Plan:   Post-operative Plan: Extubation in OR  Informed Consent: I have reviewed the patients History and Physical, chart, labs and discussed the procedure including the risks, benefits and alternatives for the proposed anesthesia with the patient or authorized representative who has indicated his/her understanding and acceptance.   Dental advisory given  Plan Discussed with: CRNA and Surgeon  Anesthesia Plan Comments:         Anesthesia Quick Evaluation

## 2018-05-04 NOTE — Op Note (Signed)
Rhonda Martinez female 62 y.o. 05/04/2018  Preoperative diagnosis: Left closed tibia and fibula fracture  Postop diagnosis: Same  Procedure: Intramedullary nailing of left tibia fracture.  Surgeon: Terance Hart   Assistants: none  Anesthesia: General endotracheal anesthesia  Implants: 10 mm x 32 cm Zimmer natural nail, 5.0 mm Interlocking screws  Findings: Distal third spiral comminuted closed tibia fracture.  Proximal third fibula fracture.  Indications:62 y.o. female who fell off of a stool yesterday on 05/03/2018.  She had immediate pain and swelling in her left leg.  She was seen at an outside facility and diagnosed with a closed distal third tibia fracture and proximal fibula fracture.  She was transferred here for evaluation after being splinted.  Upon orthopedic evaluation she was indicated for intramedullary nailing.  The risk benefits alternatives to surgery were discussed with her.  The risks included but were not limited to wound complications, infection, nonunion, malunion, need for second surgery, damage to surrounding structures, and possibly death.  After weighing these risks benefits she decided to proceed with surgery.  Procedure Detail: The patient was seen in preoperative holding area the left lower extremity was identified as the appropriate extremity and marked by myself.  The consent form was signed by myself and the patient.  Patient was taken to the operating room and placed supine on a diving board table.  All bony prominences were well-padded.  A bump was placed on the left hip.  A pre-scrub was performed with chlorhexidine sponge.  The left lower extremity was then prepped and draped in the usual sterile fashion.  A surgical pause was performed again identifying the left lower extremity as the operative extremity and the appropriate surgical procedure as intramedullary nailing of the left tibia.  The procedure was begun by using intraoperative fluoroscopy to  identify the limits of the fracture found in the distal tibia.  Then made 2 small poke hole incisions in the anterior and medial aspect about the fracture site.  Then using a tonsil blunt dissection was made down to bone.  Then using a Weber clamp and manual manipulation the fracture site was reduced.  When checking on fluoroscopy I found that the fracture had good length and rotation however the Weber clamp was translating the fragments apart.  Due to this we checked intraoperative fluoroscopy without the clamp and found to be acceptable reduction in the AP and lateral planes.  Once acceptable reduction was obtained by rotation and traction of the knee was flexed onto a triangle.  Then a 3 cm incision was made proximal to the patella through the quadriceps tendon.  This was taken sharply through the skin and subcutaneous tissue.  Then identified the peritenon over the quadricep tendon and incised 3 cm through the quadricep tendon in the longitudinal direction.  There is a large rush of synovial fluid from the knee joint.  This was suctioned out.  Then using the protector guide I placed this in the appropriate position on the proximal tibia through the patellofemoral joint.  This was confirmed on fluoroscopy to be in the appropriate position.  Then the guidewire was placed and again confirmed to be in the appropriate position.  Then an opening reamer was used to enter into the proximal tibia.  The guidewire was removed and the ball-tipped guidewire was placed down into the tibia across the fracture site and down center center at the distal end of the tibia.  We then measured for appropriate length and found this to 32  mm.  This was confirmed on fluoroscopy.  Then starting with a 9 mm end-cutting reamer and reamed up to 11.5 mm.  I did a push pass technique at the fracture site.  Then we again confirmed that the fracture was adequately reduced in the AP and lateral planes.  Then a 10 mm x 32 mm Zimmer [employer] natural  nail was placed.  Then the 3 distal interlock screws were placed without difficulty.  Then 2 proximal interlocking screws were placed without difficulty.  We then confirmed the screw lengths with fluoroscopy and found to be acceptable.  The proximal fibula fracture was evaluated and found to be reduced in acceptable fashion to be treated without internal fixation.  Final films were taken.  All counts were correct at the end of the case. The wounds were irrigated with copious amounts of saline and the quadriceps tendon was closed with 0 PDS stitch in the subcutaneous and deep dermal stitch was 3-0 Monocryl.  The skin was closed with 2-0 nylon.  Xeroform was placed on the wounds and 4 x 4's and sheet cotton with Ace wrap dressing.  She was placed in a tall walking boot.  Post Op Instructions: Touchdown weightbearing left lower extremity. She will work with physical therapy for mobilization. Keep the boot in place. Do not change the dressing. Lovenox for DVT prophylaxis x2 weeks post discharge. Follow-up with me in 2 weeks for wound check and suture removal.  She will likely be touchdown weightbearing for 4 to 6 weeks.  Estimated Blood Loss:  200 mL         Drains: none  Blood Given: none         Specimens: none       Complications:  * No complications entered in OR log *         Disposition: PACU - hemodynamically stable.         Condition: stable

## 2018-05-04 NOTE — Progress Notes (Signed)
Initial Nutrition Assessment  DOCUMENTATION CODES:   Obesity unspecified  INTERVENTION:   -Once diet, is advanced, add:   -Ensure Enlive po BID, each supplement provides 350 kcal and 20 grams of protein -MVI with minerals daily  NUTRITION DIAGNOSIS:   Increased nutrient needs related to post-op healing as evidenced by estimated needs.  GOAL:   Patient will meet greater than or equal to 90% of their needs  MONITOR:   PO intake, Supplement acceptance, Diet advancement, Labs, Weight trends, Skin, I & O's  REASON FOR ASSESSMENT:   Consult Assessment of nutrition requirement/status  ASSESSMENT:   Rhonda Martinez is a 62 y.o. female with medical history significant of COPD, former smoker, who presents with fall and left leg pain. Patient is transfered from [facility] due to fracture of left distal tibia and proximal fibular diaphysis.   Pt admitted with closed fracture of lt fibula and tibia.   Pt currently in OR for intramedullary nailing of tibia. Visited pt room, however, no family present to obtain further history. Reviewed records from [facility], which reveal that pt has experienced a 8.6% wt loss over the past 3 months (12/2017-03/2018), which is significant for time frame. Unsure if weight loss was intentional or unintentional at this time. Unable to perform nutrition-focused physical exam or obtain further nutrition history.   Pt currently NPO, but will add nutritional supplements due to weight loss and increased nutrient need for post-op healing.   Labs reviewed.  Diet Order:   Diet Order            Diet NPO time specified Except for: Ice Chips, Sips with Meds  Diet effective midnight              EDUCATION NEEDS:   No education needs have been identified at this time  Skin:  Skin Assessment: Reviewed RN Assessment  Last BM:  05/02/18  Height:   Ht Readings from Last 1 Encounters:  05/04/18 5' 5.98" (1.676 m)    Weight:   Wt Readings from  Last 1 Encounters:  05/04/18 95.3 kg    Ideal Body Weight:  59 kg  BMI:  Body mass index is 33.93 kg/m.  Estimated Nutritional Needs:   Kcal:  1800-2000  Protein:  90-105 grams  Fluid:  1.8-2.0 L    Emon Miggins A. Mayford Knife, RD, LDN, CDE Pager: [phone] After hours Pager: [phone]

## 2018-05-04 NOTE — Progress Notes (Signed)
CSW received consult for SNF. Met with patient and discussed SNF recommendation. Patient reports she will be going home with 63y/o son who cares for her and her pets, and would like home health services.   RNCM notified, following up with home health.   CSW signing off.   [place], [place]

## 2018-05-05 ENCOUNTER — Inpatient Hospital Stay (HOSPITAL_COMMUNITY): Payer: Medicare PPO

## 2018-05-05 ENCOUNTER — Other Ambulatory Visit: Payer: Self-pay

## 2018-05-05 LAB — CBC
HEMATOCRIT: 32.9 % — AB (ref 36.0–46.0)
HEMOGLOBIN: 10.5 g/dL — AB (ref 12.0–15.0)
MCH: 32.4 pg (ref 26.0–34.0)
MCHC: 31.9 g/dL (ref 30.0–36.0)
MCV: 101.5 fL — ABNORMAL HIGH (ref 78.0–100.0)
PLATELETS: 168 10*3/uL (ref 150–400)
RBC: 3.24 MIL/uL — AB (ref 3.87–5.11)
RDW: 14.7 % (ref 11.5–15.5)
WBC: 13.5 10*3/uL — AB (ref 4.0–10.5)

## 2018-05-05 LAB — BASIC METABOLIC PANEL
ANION GAP: 7 (ref 5–15)
BUN: 9 mg/dL (ref 8–23)
CO2: 27 mmol/L (ref 22–32)
Calcium: 8.7 mg/dL — ABNORMAL LOW (ref 8.9–10.3)
Chloride: 102 mmol/L (ref 98–111)
Creatinine, Ser: 0.85 mg/dL (ref 0.44–1.00)
GFR calc Af Amer: >60 mL/min (ref >60)
Glucose, Bld: 134 mg/dL — ABNORMAL HIGH (ref 70–99)
POTASSIUM: 4.5 mmol/L (ref 3.5–5.1)
Sodium: 136 mmol/L (ref 135–145)

## 2018-05-05 MED ORDER — METHOCARBAMOL 500 MG PO TABS: 500.0000 mg | ORAL_TABLET | Freq: Three times a day (TID) | ORAL | 0 refills | Status: AC | PRN

## 2018-05-05 MED ORDER — METHYLPREDNISOLONE SODIUM SUCC 125 MG IJ SOLR
40.0000 mg | Freq: Two times a day (BID) | INTRAMUSCULAR | Status: AC
  Administered 2018-05-05: 40 mg via INTRAVENOUS
  Filled 2018-05-05: qty 2

## 2018-05-05 MED ORDER — ENOXAPARIN SODIUM 40 MG/0.4ML ~~LOC~~ SOLN: 40.0000 mg | SUBCUTANEOUS | 0 refills | Status: AC

## 2018-05-05 MED ORDER — PREDNISONE 20 MG PO TABS
50.0000 mg | ORAL_TABLET | Freq: Every day | ORAL | Status: DC
  Administered 2018-05-06 – 2018-05-07 (×2): 50 mg via ORAL
  Filled 2018-05-05 (×2): qty 2

## 2018-05-05 MED ORDER — IPRATROPIUM-ALBUTEROL 0.5-2.5 (3) MG/3ML IN SOLN: 3.0000 mL | RESPIRATORY_TRACT | Status: DC | PRN

## 2018-05-05 MED ORDER — PNEUMOCOCCAL VAC POLYVALENT 25 MCG/0.5ML IJ INJ
0.5000 mL | INJECTION | INTRAMUSCULAR | Status: DC
  Filled 2018-05-05: qty 0.5

## 2018-05-05 MED ORDER — OXYCODONE-ACETAMINOPHEN 5-325 MG PO TABS: 1.0000 | ORAL_TABLET | ORAL | 0 refills | Status: AC | PRN

## 2018-05-05 NOTE — Progress Notes (Signed)
   PATIENT ID: Rhonda Martinez   1 Day Post-Op Procedure(s) (LRB): INTRAMEDULLARY (IM) NAIL TIBIAL (Left)  Subjective: Doing okay this morning. No pain in left leg. Right foot/ankle painful and swollen. Would like this to be evaluated.   Objective:  Vitals:   05/05/18 0459 05/05/18 0856  BP: 99/77 128/67  Pulse: 85 91  Resp:  16  Temp: 98.3 F (36.8 C) 98.6 F (37 C)  SpO2: 94% 94%     L LE with mild swelling, wiggles toes, distally NVI Calves soft nontender R LE foot and ankle with swelling and ecchymosis   TTP midfoot and lateral mal  Labs:  Recent Labs    05/04/18 0459 05/05/18 0625  HGB 11.3* 10.5*   Recent Labs    05/04/18 0459 05/05/18 0625  WBC 9.3 13.5*  RBC 3.47* 3.24*  HCT 35.2* 32.9*  PLT 174 168   Recent Labs    05/04/18 0459 05/05/18 0625  NA 137 136  K 4.3 4.5  CL 103 102  CO2 25 27  BUN 9 9  CREATININE 0.97 0.85  GLUCOSE 146* 134*  CALCIUM 9.0 8.7*    Assessment and Plan: 1 day s/p ORIF tibial nail left ABLA- expected, will monitor Min pain, continue rx plan R foot/ankle with pain and swelling, will get xrays this am and recheck for injury Touchdown weightbearing left lower extremity. Up with PT today Keep the boot in place. Do not change the dressing. Lovenox for DVT prophylaxis x2 weeks post discharge. Follow-up with Dr. Susa Simmonds in 2 weeks for wound check and suture removal.  She will likely be touchdown weightbearing for 4 to 6 weeks.

## 2018-05-05 NOTE — Progress Notes (Deleted)
Pt A&Ox4. States she had delusions she believes is from oxycodone. Denies any delusions or hallucinations now. Made MD aware.  

## 2018-05-05 NOTE — Progress Notes (Signed)
Orthopedic Tech Progress Note Patient Details:  Zoiey Christy Jan 28, 1956 [id number]  Ortho Devices Type of Ortho Device: Postop shoe/boot Ortho Device/Splint Location: rle Ortho Device/Splint Interventions: Application   Post Interventions Patient Tolerated: Well Instructions Provided: Care of device Trapeze bar patient helper  Nikki Dom 05/05/2018, 11:38 AM

## 2018-05-05 NOTE — Plan of Care (Signed)
  Problem: Education: Goal: Knowledge of General Education information will improve Description Including pain rating scale, medication(s)/side effects and non-pharmacologic comfort measures Outcome: Progressing   

## 2018-05-05 NOTE — Progress Notes (Signed)
[facility] Progress Note  Subjective: feeling better today, slept better, SOB resolved  Vitals:   05/05/18 0119 05/05/18 0459 05/05/18 0856 05/05/18 0919  BP: 110/64 99/77 128/67   Pulse: 91 85 91 94  Resp:   16 18  Temp: 98.3 F (36.8 C) 98.3 F (36.8 C) 98.6 F (37 C)   TempSrc: Oral Oral Oral   SpO2: 93% 94% 94% 94%  Weight:      Height:        Inpatient medications: . DULoxetine  60 mg Oral BID  . enoxaparin (LOVENOX) injection  40 mg Subcutaneous Q24H  . feeding supplement (ENSURE ENLIVE)  237 mL Oral BID BM  . methylPREDNISolone (SOLU-MEDROL) injection  40 mg Intravenous Q12H  . multivitamin with minerals  1 tablet Oral Daily  . [START ON 05/06/2018] pneumococcal 23 valent vaccine  0.5 mL Intramuscular Tomorrow-1000  . QUEtiapine  200 mg Oral Q breakfast  . QUEtiapine  400 mg Oral QHS    albuterol, dextromethorphan-guaiFENesin, ipratropium-albuterol, methocarbamol, morphine injection, oxyCODONE-acetaminophen, senna-docusate  Exam:  alert, no distress, on nasal O2  no jvd  chest CTA bilat, no wheezing or rales  cor reg no mrg  Abd obese soft ntnd no mass or ascites  Ext no edema RLE, L leg wrapped in bandage w/ leg brace in place from mid thigh to ankle  NF, Ox 3, pleasant    Presentation Summary: Rhonda Martinez is a 62 y.o. female with medical history significant of COPD, former smoker, who presented with fall and left leg pain.  Patient was transfered from [facility] due to fracture of left distal tibia and proximal fibular diaphysis.  Pt stated that she accidentally slipped and fell off the toilet in the early AM, developed severe pain in left lowe leg. Pt was initially seen in the ED of [facility]. Images showed mildly comminuted and minimally displaced proximal fibular diaphysis fracture with mild apex lateral angulation and lateral displacement, and comminuted angulated fracture of the distal tibia. Pt was evaluated by orthopedic surgeon, Dr.  Ulice Brilliant there, and he recommendedpatient would benefit to be evaluated by orthopedic at a bigger center or this could also be done outpatient.  In the mean time, pt was noted to have confusion and drowssiness. She was also found to be hypoxia likely secondary to getting IV pain medications including 4 mg of IV morphine 1 mg of IV Dilaudid. Ct-head and MRI of brain were done there, which are all negative for acute intracranial issues. ABG showed pH 7.333, PCO2 51 and PO2 51. Pt was put on splint.  X-ray of her abdomen showed no evidence of bowel obstruction or ileus.  Chest x-ray showed poor inspiration with minimal patchy and linear atelectasis at the right lung base and stable mild chronic interstitial lung disease.  Patient was evaluate by neurology who thought this was either concussion related or metabolic encephalopathy. Dr Phylis Bougie (ED physician) consulted ortho, Dr Tinnie Gens of [facility] hospital.  On arrival to [facility] patient was drowsy, but alert, oriented x3.  She stated that she had a severe pain in the left lower leg, which is constant, sharp, 10 out of 10 in severity.  She said she did not have head injury or loss of consciousness when she fell.  She states that she has dry cough, but does not feel shortness of breath.  No chest pain, fever or chills.  No nausea, vomiting, diarrhea, abdominal pain.  Denies symptoms of UTI or unilateral weakness.  No facial droop  or slurred speech.  Labs: WBC 8.0, hemoglobin 12.9, platelets 155, electrolytes renal function okay, lactic acid 1.1, procalcitonin less than 0.05. Negative UA.        Hospital Problems/ Course:  Closed fracture of left fibula and tibia: EDP in [facility] consulted Dr. Leotis Shames of ortho. They recommended pt be transferred, pt was sent to [facility] 9/26 and had surgery Friday 9/27 by Dr Adair/ orthopedics - S/P IM nailing of distal left tibial fracture 05/04/18; prox fibula fracture not treated - pain control good - Robaxin for muscle spasm - per ortho  today >> Up w/ PT today, touchdown WB of LLE, keep boot in place, Lovenox x 2 wks post discharge, f/u ortho/ [place] in 2 wks; likely will be touchdown WB for 4-6 wks - daily CBC, Hb 10.5 today, stable  Fall: seemed to be a mechanica fall. MRI-brain and CT-head negative.  -pt/ot when able  Acute on chronic respiratory failure with hypoxia/ COPD exacerbation: Patient had hypoxia.  Currently oxygen saturation 95% on 2 L nasal cannula oxygen. She was wheezing, likely COPD exac - improving on exam, lungs clear now  - cont Nebulizers: scheduled Duoneb and prn albuterol -lowered IV steroids to 40 IV bid yest, will change to po pred 50 mg/d starting tomorrow -Mucinex for cough  -Incentive spirometry -[place] O2  Anxiety/ depression/ insomnia : 4 medications listed on home list PTA meds - Wellbutrin, Buspar, Cymbalta and Seroquel.   - patient states she is only taking Cymbalta and Seroquel, did resume on 9/27 - slept much better w/ meds on board  Acute metabolic encephalopathy: resolved. CT head and MRI for brain negative for acute intracranial abnormalities. patient was evaluate by neurology who thought this was either concussion related or metabolic.  No findings on physical examination.  Urinalysis was negative - no further w/u   DVT ppx: SCD Code Status: Full code Family Communication: None at bed side.      Disposition Plan: SNF recommended by PT, SW consulted/ FL2 signed Consults called: Ortho, Dr. Susa Simmonds Admission status: Inpatient/tele      Vinson Moselle MD [facility] pgr [phone] 05/05/2018, 12:21 PM   Recent Labs  Lab 05/04/18 0459 05/05/18 0625  NA 137 136  K 4.3 4.5  CL 103 102  CO2 25 27  GLUCOSE 146* 134*  BUN 9 9  CREATININE 0.97 0.85  CALCIUM 9.0 8.7*   No results for input(s): AST, ALT, ALKPHOS, BILITOT, PROT, ALBUMIN in the last 168 hours. Recent Labs  Lab 05/04/18 0459 05/05/18 0625  WBC 9.3 13.5*  HGB 11.3* 10.5*  HCT 35.2* 32.9*  MCV  101.4* 101.5*  PLT 174 168   Iron/TIBC/Ferritin/ %Sat No results found for: IRON, TIBC, FERRITIN, IRONPCTSAT

## 2018-05-05 NOTE — Evaluation (Signed)
Physical Therapy Evaluation Patient Details Name: Rhonda Martinez MRN: [id number] DOB: [birth year] Today's Date: 05/05/2018   History of Present Illness  Patient fell in her bathroom and fractured her left tib/fib. She also has a right metatarsel fx. She had an ORIF on 05/04/2018. PMH: COPD   Clinical Impression  Patient required Mod -> max a for all transfers. She had pain transferring to a chair. She was motivated and able to participate but limited. She would benefit from rehab at a SNF. She hopes to get into a specific nursing home but if she can not she plans on going home. She will not be safe at home without significant assistance 24 hours a day. She would benefit most from rehab. Acute PT will continue to follow.     Follow Up Recommendations SNF    Equipment Recommendations  Rolling walker with 5" wheels    Recommendations for Other Services Rehab consult     Precautions / Restrictions Precautions Precautions: Fall Restrictions Weight Bearing Restrictions: Yes RLE Weight Bearing: Weight bearing as tolerated LLE Weight Bearing: Non weight bearing      Mobility  Bed Mobility Overal bed mobility: Needs Assistance Bed Mobility: Supine to Sit     Supine to sit: Max assist     General bed mobility comments: Max a to the edge of the bed. {atient took her time and was able to participate but had pain and required assist to scoot and assit to control her l LE out of the bed  Transfers Overall transfer level: Needs assistance Equipment used: Rolling walker (2 wheeled) Transfers: Sit to/from [employer] Sit to Stand: Mod assist Stand pivot transfers: Max assist       General transfer comment: Patient transfered from sit to stand with mod a for strength and balance. In order to transfer she required max a. She would be safest with (+) 2 transfer.   Ambulation/Gait                Stairs            Wheelchair Mobility    Modified Rankin (Stroke  Patients Only)       Balance Overall balance assessment: Needs assistance Sitting-balance support: Bilateral upper extremity supported Sitting balance-Leahy Scale: Fair     Standing balance support: Bilateral upper extremity supported Standing balance-Leahy Scale: Poor                               Pertinent Vitals/Pain Pain Assessment: Faces Faces Pain Scale: Hurts whole lot Pain Location: left lower leg  Pain Descriptors / Indicators: Aching Pain Intervention(s): Limited activity within patient's tolerance;Monitored during session;Repositioned;Premedicated before session    Home Living Family/patient expects to be discharged to:: Skilled nursing facility                 Additional Comments: Patient lives alone at home. Her husband passed last year. She would like to get into a skilled facility in [place]. If she can not get into her specific place she plans to go home. Her house is one level     Prior Function Level of Independence: Independent         Comments: Patient reports she was active but had been bumping into things for a few weeks      Hand Dominance        Extremity/Trunk Assessment   Upper Extremity Assessment Upper Extremity Assessment: Overall WFL for  tasks assessed    Lower Extremity Assessment Lower Extremity Assessment: LLE deficits/detail LLE: Unable to fully assess due to pain LLE Sensation: WNL LLE Coordination: WNL    Cervical / Trunk Assessment Cervical / Trunk Assessment: Normal  Communication   Communication: No difficulties  Cognition Arousal/Alertness: Awake/alert Behavior During Therapy: WFL for tasks assessed/performed Overall Cognitive Status: Within Functional Limits for tasks assessed                                        General Comments  surgical shope on right; CAM walker boot on the left     Exercises     Assessment/Plan    PT Assessment Patient needs continued PT  services  PT Problem List Decreased strength;Decreased range of motion;Decreased activity tolerance;Decreased mobility;Decreased knowledge of use of DME       PT Treatment Interventions DME instruction;Gait training;Functional mobility training;Therapeutic activities;Therapeutic exercise;Neuromuscular re-education;Patient/family education    PT Goals (Current goals can be found in the Care Plan section)  Acute Rehab PT Goals Patient Stated Goal: to get out of bed and start moving better.  PT Goal Formulation: With patient Time For Goal Achievement: 05/19/18 Potential to Achieve Goals: Good    Frequency Min 4X/week   Barriers to discharge Decreased caregiver support      Co-evaluation               AM-PAC PT "6 Clicks" Daily Activity  Outcome Measure Difficulty turning over in bed (including adjusting bedclothes, sheets and blankets)?: Unable Difficulty moving from lying on back to sitting on the side of the bed? : Unable Difficulty sitting down on and standing up from a chair with arms (e.g., wheelchair, bedside commode, etc,.)?: Unable Help needed moving to and from a bed to chair (including a wheelchair)?: Total Help needed walking in hospital room?: Total Help needed climbing 3-5 steps with a railing? : Total 6 Click Score: 6    End of Session Equipment Utilized During Treatment: Gait belt Activity Tolerance: Patient limited by pain Patient left: in chair;with call bell/phone within reach Nurse Communication: Mobility status PT Visit Diagnosis: Unsteadiness on feet (R26.81);Muscle weakness (generalized) (M62.81);Difficulty in walking, not elsewhere classified (R26.2)    Time: 6045-4098 PT Time Calculation (min) (ACUTE ONLY): 24 min   Charges:   PT Evaluation $PT Eval Moderate Complexity: 1 Mod          Dessie Coma PT DPT  05/05/2018, 2:32 PM

## 2018-05-05 NOTE — Plan of Care (Signed)
  Problem: Pain Managment: Goal: General experience of comfort will improve Outcome: Progressing   

## 2018-05-05 NOTE — Plan of Care (Signed)
  Problem: Pain Managment: Goal: General experience of comfort will improve Outcome: Progressing   Problem: Safety: Goal: Ability to remain free from injury will improve Outcome: Progressing   Problem: Skin Integrity: Goal: Risk for impaired skin integrity will decrease Outcome: Progressing   

## 2018-05-06 ENCOUNTER — Inpatient Hospital Stay (HOSPITAL_COMMUNITY): Payer: Medicare PPO

## 2018-05-06 LAB — CBC
HEMATOCRIT: 30 % — AB (ref 36.0–46.0)
HEMOGLOBIN: 9.6 g/dL — AB (ref 12.0–15.0)
MCH: 32.8 pg (ref 26.0–34.0)
MCHC: 32 g/dL (ref 30.0–36.0)
MCV: 102.4 fL — AB (ref 78.0–100.0)
Platelets: 164 10*3/uL (ref 150–400)
RBC: 2.93 MIL/uL — AB (ref 3.87–5.11)
RDW: 14.7 % (ref 11.5–15.5)
WBC: 9.2 10*3/uL (ref 4.0–10.5)

## 2018-05-06 MED ORDER — LEVOTHYROXINE SODIUM 25 MCG PO TABS
25.0000 ug | ORAL_TABLET | Freq: Every day | ORAL | Status: DC
  Administered 2018-05-07: 25 ug via ORAL
  Filled 2018-05-06: qty 1

## 2018-05-06 NOTE — Progress Notes (Signed)
   PATIENT ID: Rhonda Martinez   2 Days Post-Op Procedure(s) (LRB): INTRAMEDULLARY (IM) NAIL TIBIAL (Left)  Subjective: States shes feeling better this am. Was happy to transfer with PT yesterday and looking forward to PT today. Hopes to d/c to SNF of choice tomorrow.   Objective:  Vitals:   05/05/18 2005 05/06/18 0449  BP: 106/68 100/67  Pulse: 88 72  Resp:    Temp: 98.5 F (36.9 C) 98.1 F (36.7 C)  SpO2: 94% 91%     L LE with mild swelling, wiggles toes, distally NVI Calves soft nontender R LE foot and ankle with swelling and ecchymosis, in well fitted post op shoe  Labs:  Recent Labs    05/04/18 0459 05/05/18 0625 05/06/18 0525  HGB 11.3* 10.5* 9.6*   Recent Labs    05/05/18 0625 05/06/18 0525  WBC 13.5* 9.2  RBC 3.24* 2.93*  HCT 32.9* 30.0*  PLT 168 164   Recent Labs    05/04/18 0459 05/05/18 0625  NA 137 136  K 4.3 4.5  CL 103 102  CO2 25 27  BUN 9 9  CREATININE 0.97 0.85  GLUCOSE 146* 134*  CALCIUM 9.0 8.7*    Assessment and Plan: 1 day s/p ORIF tibial nail left ABLA- expected, asymptomatic, will monitor Min pain, continue rx plan R 4th metatarsal fx- in post op shoe and can be weightbearing on this side Touchdown weightbearing left lower extremity Up with PT today Keep the boot in place. Do not change the dressing. Lovenox for DVT prophylaxis x2 weeks post discharge. Follow-up with Dr. Susa Martinez in 2 weeks for wound check and suture removal. She will likely be touchdownweightbearing for 4 to 6 weeks. Likely d/c to SNF tomorrow when bed available

## 2018-05-06 NOTE — Progress Notes (Signed)
PROGRESS NOTE  Rhonda Martinez WUJ:[id number] DOB: 12/27/1955 DOA: 05/03/2018 PCP: Patient, No Pcp Per  HPI/Recap of past 24 hours: Rhonda Martinez a 62 y.o.femalewith medical history significant ofCOPD,former smoker, who presented with fall and left leg pain.  Patient was transferedfrom [facility] due to fracture of left distaltibia and proximal fibular diaphysis.  Pt stated that sheaccidentally slipped and felloff the toilet in the early AM, developed severe pain in left lower leg. Pt was initially seen in the ED of [facility]. Images showedmildly comminuted and minimally displaced proximal fibular diaphysis fracture with mild apex lateral angulation and lateral displacement, andcomminuted angulated fracture of the distal tibia. Pt wasevaluated by orthopedic surgeon, Dr. Ulice Brilliant there, andherecommended patient would benefit to be evaluated by orthopedic at a bigger center or this could also be done outpatient. In the mean time, pt was noted to have confusion and drowssiness.She was also found to be hypoxialikely secondary to getting IV pain medications including 4 mg of IV morphine 1 mg of IV Dilaudid. Ct-head and MRI of brain were done there, which are allnegative for acute intracranial issues.ABG showedpH 7.333, PCO2 51andPO2 51. Pt was put on splint.X-ray of her abdomen showed no evidence of bowel obstruction or ileus. Chest x-ray showed poor inspiration with minimal patchy and linear atelectasis at the right lung base and stable mild chronic interstitial lung disease. Patient was evaluate by neurology who thought this was either concussion related or metabolic encephalopathy. Dr Phylis Bougie (ED physician) consulted ortho,Dr [facility].  On arrival to [facility] patient was drowsy, but alert, oriented x3. She stated that she had a severe pain in the left lower leg, which is constant, sharp, 10 out of 10 in severity. She said she did not have head injury or loss  of consciousness when she fell. She states that she has dry cough, but does not feel shortness of breath. No chest pain, fever or chills. No nausea, vomiting, diarrhea, abdominal pain. Denies symptoms of UTI or unilateral weakness. No facial droop or slurred speech.  Labs:WBC 8.0, hemoglobin 12.9, platelets 155,electrolytes renal function okay, lactic acid 1.1, procalcitonin less than 0.05.Negative UA.  05/06/2018: Patient seen and examined at bedside.  Reports moderate pain at her left ankle prior to her pain medication.  Becomes hypoxic with O2 saturation in the low 80s when not on oxygen.  Denies O2 supplementation at home.  Will obtain portable chest x-ray.  Encouraged to use incentive spirometry at bedside.    Assessment/Plan: Principal Problem:   Closed fracture of left fibula and tibia Active Problems:   Closed fracture of left distal tibia   Fall   COPD (chronic obstructive pulmonary disease) (HCC)   Acute on chronic respiratory failure with hypoxia (HCC)   Acute metabolic encephalopathy   Surgery, elective  Closed fracture of left fibula and tibia POD #2 post repair Orthopedic surgery following PT per orthopedic surgery Monitor H&H Obtain CBC in the morning Pain management and bowel regimen in place  Acute hypoxic respiratory failure" etiology Possible atelectasis Patient encouraged to increase her incentive spirometer at bedside Obtain chest x-ray portable Maintain O2 saturation greater than 92% with O2 supplementation Also on prednisone 50 mg daily Continue to monitor vital signs closely   Chronic anxiety/depression Continue duloxetine, Seroquel  Hypothyroidism Resume levothyroxine  Code Status: Full code  Family Communication: None at bedside  Disposition Plan: SNF possibly tomorrow 05/07/2018 or when orthopedic surgery signs off.   Consultants:  Orthopedic surgery  Procedures:  Surgical repair of left  ankle  fracture  Antimicrobials:  None  DVT prophylaxis: Subcu Lovenox daily   Objective: Vitals:   05/05/18 0919 05/05/18 1322 05/05/18 2005 05/06/18 0449  BP:  111/74 106/68 100/67  Pulse: 94 99 88 72  Resp: 18 16    Temp:  98.1 F (36.7 C) 98.5 F (36.9 C) 98.1 F (36.7 C)  TempSrc:  Oral Oral Oral  SpO2: 94% 95% 94% 91%  Weight:      Height:        Intake/Output Summary (Last 24 hours) at 05/06/2018 1449 Last data filed at 05/06/2018 0500 Gross per 24 hour  Intake -  Output 1250 ml  Net -1250 ml   Filed Weights   05/04/18 1102  Weight: 95.3 kg    Exam:  . General: 62 y.o. year-old female well developed well nourished in no acute distress.  Alert and oriented x3. . Cardiovascular: Regular rate and rhythm with no rubs or gallops.  No thyromegaly or JVD noted.   Marland Kitchen Respiratory: Clear to auscultation with no wheezes or rales. Good inspiratory effort. . Abdomen: Soft nontender nondistended with normal bowel sounds x4 quadrants. . Musculoskeletal: Left lower extremity with mild swelling.  Able to wiggle her toes. 2/4 pulses in all 4 extremities. Marland Kitchen Psychiatry: Mood is appropriate for condition and setting   Data Reviewed: CBC: Recent Labs  Lab 05/04/18 0459 05/05/18 0625 05/06/18 0525  WBC 9.3 13.5* 9.2  HGB 11.3* 10.5* 9.6*  HCT 35.2* 32.9* 30.0*  MCV 101.4* 101.5* 102.4*  PLT 174 168 164   Basic Metabolic Panel: Recent Labs  Lab 05/04/18 0459 05/05/18 0625  NA 137 136  K 4.3 4.5  CL 103 102  CO2 25 27  GLUCOSE 146* 134*  BUN 9 9  CREATININE 0.97 0.85  CALCIUM 9.0 8.7*   GFR: Estimated Creatinine Clearance: 78.8 mL/min (by C-G formula based on SCr of 0.85 mg/dL). Liver Function Tests: No results for input(s): AST, ALT, ALKPHOS, BILITOT, PROT, ALBUMIN in the last 168 hours. No results for input(s): LIPASE, AMYLASE in the last 168 hours. No results for input(s): AMMONIA in the last 168 hours. Coagulation Profile: Recent Labs  Lab 05/03/18 1951   INR 1.01   Cardiac Enzymes: No results for input(s): CKTOTAL, CKMB, CKMBINDEX, TROPONINI in the last 168 hours. BNP (last 3 results) No results for input(s): PROBNP in the last 8760 hours. HbA1C: No results for input(s): HGBA1C in the last 72 hours. CBG: No results for input(s): GLUCAP in the last 168 hours. Lipid Profile: No results for input(s): CHOL, HDL, LDLCALC, TRIG, CHOLHDL, LDLDIRECT in the last 72 hours. Thyroid Function Tests: No results for input(s): TSH, T4TOTAL, FREET4, T3FREE, THYROIDAB in the last 72 hours. Anemia Panel: No results for input(s): VITAMINB12, FOLATE, FERRITIN, TIBC, IRON, RETICCTPCT in the last 72 hours. Urine analysis: No results found for: COLORURINE, APPEARANCEUR, LABSPEC, PHURINE, GLUCOSEU, HGBUR, BILIRUBINUR, KETONESUR, PROTEINUR, UROBILINOGEN, NITRITE, LEUKOCYTESUR Sepsis Labs: @LABRCNTIP (procalcitonin:4,lacticidven:4)  ) Recent Results (from the past 240 hour(s))  Surgical pcr screen     Status: None   Collection Time: 05/03/18 11:25 PM  Result Value Ref Range Status   MRSA, PCR NEGATIVE NEGATIVE Final   Staphylococcus aureus NEGATIVE NEGATIVE Final    Comment: (NOTE) The Xpert SA Assay (FDA approved for NASAL specimens in patients 49 years of age and older), is one component of a comprehensive surveillance program. It is not intended to diagnose infection nor to guide or monitor treatment. Performed at [facility], [address], [place], [place] [address]       Studies: No results found.  Scheduled Meds: . DULoxetine  60 mg Oral BID  . enoxaparin (LOVENOX) injection  40 mg Subcutaneous Q24H  . feeding supplement (ENSURE ENLIVE)  237 mL Oral BID BM  . multivitamin with minerals  1 tablet Oral Daily  . pneumococcal 23 valent vaccine  0.5 mL Intramuscular Tomorrow-1000  . predniSONE  50 mg Oral Q breakfast  . QUEtiapine  200 mg Oral Q breakfast  . QUEtiapine  400 mg Oral QHS    Continuous Infusions:   LOS: 3 days      Darlin Drop, MD [facility] Pager [phone]  If 7PM-7AM, please contact night-coverage [email or web address] Password [facility] 05/06/2018, 2:49 PM

## 2018-05-07 ENCOUNTER — Encounter (HOSPITAL_COMMUNITY): Payer: Self-pay | Admitting: Orthopaedic Surgery

## 2018-05-07 DIAGNOSIS — S82202A Unspecified fracture of shaft of left tibia, initial encounter for closed fracture: Secondary | ICD-10-CM

## 2018-05-07 DIAGNOSIS — J431 Panlobular emphysema: Secondary | ICD-10-CM

## 2018-05-07 DIAGNOSIS — S82402A Unspecified fracture of shaft of left fibula, initial encounter for closed fracture: Secondary | ICD-10-CM

## 2018-05-07 DIAGNOSIS — G9341 Metabolic encephalopathy: Secondary | ICD-10-CM

## 2018-05-07 DIAGNOSIS — R0902 Hypoxemia: Secondary | ICD-10-CM

## 2018-05-07 LAB — CBC
HCT: 29.2 % — ABNORMAL LOW (ref 36.0–46.0)
HEMOGLOBIN: 9.3 g/dL — AB (ref 12.0–15.0)
MCH: 32.3 pg (ref 26.0–34.0)
MCHC: 31.8 g/dL (ref 30.0–36.0)
MCV: 101.4 fL — ABNORMAL HIGH (ref 78.0–100.0)
Platelets: 175 10*3/uL (ref 150–400)
RBC: 2.88 MIL/uL — AB (ref 3.87–5.11)
RDW: 14.7 % (ref 11.5–15.5)
WBC: 10 10*3/uL (ref 4.0–10.5)

## 2018-05-07 MED ORDER — POLYETHYLENE GLYCOL 3350 17 G PO PACK: 17.0000 g | PACK | Freq: Every day | ORAL | 0 refills | Status: AC

## 2018-05-07 MED ORDER — SENNOSIDES-DOCUSATE SODIUM 8.6-50 MG PO TABS: 1.0000 | ORAL_TABLET | Freq: Every evening | ORAL | Status: AC | PRN

## 2018-05-07 MED ORDER — POLYETHYLENE GLYCOL 3350 17 G PO PACK
17.0000 g | PACK | Freq: Every day | ORAL | Status: DC
  Administered 2018-05-07: 17 g via ORAL
  Filled 2018-05-07: qty 1

## 2018-05-07 MED ORDER — ENSURE ENLIVE PO LIQD: 237.0000 mL | Freq: Two times a day (BID) | ORAL | 12 refills | Status: AC

## 2018-05-07 MED ORDER — PREDNISONE 10 MG PO TABS: ORAL_TABLET | ORAL | 0 refills | Status: AC

## 2018-05-07 NOTE — Progress Notes (Signed)
Patient discharging to [place]. Report called in to [facility] LPN. Awaiting transportation.

## 2018-05-07 NOTE — Discharge Summary (Signed)
PATIENT DETAILS Name: Rhonda Martinez Age: 62 y.o. Sex: female Date of Birth: [birth year] MRN: [id number]. Admitting Physician: Lorretta Harp, MD WUJ:[id number], No Pcp Per  Admit Date: 05/03/2018 Discharge date: 05/07/2018  Recommendations for Outpatient Follow-up:  1. Follow up with PCP in 1-2 weeks 2. Please obtain BMP/CBC in one week 3. Please ensure  follow-up with orthopedics  Admitted From:  Home  Disposition: SNF  Home Health: No  Equipment/Devices: None  Discharge Condition: Stable  CODE STATUS: FULL CODE  Diet recommendation:  Heart Healthy   Brief Summary: See H&P, Labs, Consult and Test reports for all details in brief, patient is a 62 year old female with history of COPD who sustained a mechanical fall and had a resultant closed fracture of the left tibia and fibula.  She was also found to have COPD with exacerbation.  She was subsequently admitted to the hospitalist service-see below for further details.  Brief Hospital Course: Closed fracture of the left tibia and fibula: Secondary to mechanical fall.  Underwent IM nailing of the left distal tibial fracture on 9/27.  Stable for SNF-orthopedic recommending anticoagulation with Lovenox for 2 weeks, touchdown weightbearing to the left lower extremity-to keep boot in place.  Please ensure patient follows with Subutex in 2 weeks.  Acute hypoxic respiratory failure secondary to COPD exacerbation: Treated with steroids, bronchodilators with significant clinical improvement-back on room air this morning.  Taper steroids over the next few days.  Continue inhalers/bronchodilators on discharge.  Mechanical fall: Resulting in above fracture-no syncope-MRI brain and CT head negative for significant abnormalities.  Mild perioperative blood loss anemia: Stable without need for transfusion.  Repeat CBC in the next week or so.  Acute metabolic encephalopathy: Resolved-MRI brain negative.  No further work-up  required.  Anxiety/depression and insomnia: All appear to be stable-resume Cymbalta and Seroquel.  Procedures/Studies: IM nailing of distal left tibial fracture 05/04/18  Discharge Diagnoses:  Principal Problem:   Closed fracture of left fibula and tibia Active Problems:   Closed fracture of left distal tibia   Fall   COPD (chronic obstructive pulmonary disease) (HCC)   Acute on chronic respiratory failure with hypoxia (HCC)   Acute metabolic encephalopathy   Surgery, elective   Discharge Instructions:  Activity:  Touchdown weightbearing to left lower extremity  Discharge Instructions    Call MD for:  redness, tenderness, or signs of infection (pain, swelling, redness, odor or green/yellow discharge around incision site)   Complete by:  As directed    Diet - low sodium heart healthy   Complete by:  As directed    Discharge instructions   Complete by:  As directed    Follow with Primary MD in 1-2 weeks  Follow with Ortho-Dr Susa Simmonds in 2 weeks  Please get a complete blood count and chemistry panel checked by your Primary MD at your next visit, and again as instructed by your Primary MD.  Get Medicines reviewed and adjusted: Please take all your medications with you for your next visit with your Primary MD  Laboratory/radiological data: Please request your Primary MD to go over all hospital tests and procedure/radiological results at the follow up, please ask your Primary MD to get all Hospital records sent to his/her office.  In some cases, they will be blood work, cultures and biopsy results pending at the time of your discharge. Please request that your primary care M.D. follows up on these results.  Also Note the following: If you experience worsening of your admission symptoms, develop shortness of  breath, life threatening emergency, suicidal or homicidal thoughts you must seek medical attention immediately by calling 911 or calling your MD immediately  if symptoms less  severe.  You must read complete instructions/literature along with all the possible adverse reactions/side effects for all the Medicines you take and that have been prescribed to you. Take any new Medicines after you have completely understood and accpet all the possible adverse reactions/side effects.   Do not drive when taking Pain medications or sleeping medications (Benzodaizepines)  Do not take more than prescribed Pain, Sleep and Anxiety Medications. It is not advisable to combine anxiety,sleep and pain medications without talking with your primary care practitioner  Special Instructions: If you have smoked or chewed Tobacco  in the last 2 yrs please stop smoking, stop any regular Alcohol  and or any Recreational drug use.  Wear Seat belts while driving.  Please note: You were cared for by a hospitalist during your hospital stay. Once you are discharged, your primary care physician will handle any further medical issues. Please note that NO REFILLS for any discharge medications will be authorized once you are discharged, as it is imperative that you return to your primary care physician (or establish a relationship with a primary care physician if you do not have one) for your post hospital discharge needs so that they can reassess your need for medications and monitor your lab values.   Increase activity slowly   Complete by:  As directed    Weightbearing as tolerated right lower extremity.   Touchdown weightbearing left lower extremity.     Allergies as of 05/07/2018   No Known Allergies     Medication List    STOP taking these medications   baclofen 10 MG tablet Commonly known as:  LIORESAL   buPROPion 100 MG 12 hr tablet Commonly known as:  WELLBUTRIN SR   ibuprofen 200 MG tablet Commonly known as:  ADVIL,MOTRIN     TAKE these medications   albuterol (2.5 MG/3ML) 0.083% nebulizer solution Commonly known as:  PROVENTIL Inhale 2.5 mg into the lungs every 6 (six) hours  as needed.   VENTOLIN HFA 108 (90 Base) MCG/ACT inhaler Generic drug:  albuterol Inhale 1-2 puffs into the lungs every 4 (four) hours as needed.   busPIRone 10 MG tablet Commonly known as:  BUSPAR Take 20 mg by mouth 2 (two) times daily.   DULoxetine 60 MG capsule Commonly known as:  CYMBALTA Take 60 mg by mouth 2 (two) times daily.   enoxaparin 40 MG/0.4ML injection Commonly known as:  LOVENOX Inject 0.4 mLs (40 mg total) into the skin daily for 14 days.   feeding supplement (ENSURE ENLIVE) Liqd Take 237 mLs by mouth 2 (two) times daily between meals.   levothyroxine 25 MCG tablet Commonly known as:  SYNTHROID, LEVOTHROID Take 25 mcg by mouth daily before breakfast.   methocarbamol 500 MG tablet Commonly known as:  ROBAXIN Take 1 tablet (500 mg total) by mouth every 8 (eight) hours as needed for muscle spasms.   multivitamin with minerals Tabs tablet Take 1 tablet by mouth daily.   omeprazole 40 MG capsule Commonly known as:  PRILOSEC Take 40 mg by mouth daily.   oxyCODONE-acetaminophen 5-325 MG tablet Commonly known as:  PERCOCET/ROXICET Take 1-2 tablets by mouth every 4 (four) hours as needed for moderate pain.   polyethylene glycol packet Commonly known as:  MIRALAX / GLYCOLAX Take 17 g by mouth daily. Start taking on:  05/08/2018   predniSONE  10 MG tablet Commonly known as:  DELTASONE Take 4 tablets (40 mg) daily for 2 days, then, Take 3 tablets (30 mg) daily for 2 days, then, Take 2 tablets (20 mg) daily for 2 days, then, Take 1 tablets (10 mg) daily for 1 days, then stop   QUEtiapine 200 MG tablet Commonly known as:  SEROQUEL Take 200-400 mg by mouth 2 (two) times daily. Take 1 tablet in the morning and 2 tablets at night   SENNA LAXATIVE 8.6 MG tablet Generic drug:  senna Take 3 tablets by mouth daily as needed.   senna-docusate 8.6-50 MG tablet Commonly known as:  Senokot-S Take 1 tablet by mouth at bedtime as needed for mild constipation.    STIOLTO RESPIMAT 2.5-2.5 MCG/ACT Aers Generic drug:  Tiotropium Bromide-Olodaterol Inhale 2 puffs into the lungs daily.      Follow-up Information    Terance Hart, MD Follow up.   Specialty:  Orthopedic Surgery Why:  Please call to schedule appointment for 2 weeks after surgery.  Contact information: [address] [place] [address] [phone]        Primary MD. Schedule an appointment as soon as possible for a visit in 2 week(s).          No Known Allergies    Consultations: Orthopedics Other Procedures/Studies: Dg Tibia/fibula Left  Result Date: 05/04/2018 CLINICAL DATA:  INTRAMEDULLARY (IM) NAIL TIBIAL - LEFT EXAM: LEFT TIBIA AND FIBULA - 2 VIEW; DG C-ARM 61-120 MIN COMPARISON:  None. FINDINGS: Images are performed of the proximal and distal LEFT tibia, demonstrating intramedullary nail fixation with proximal and distal cortical screws. A minimally displaced oblique fracture of the proximal LEFT fibula is present. IMPRESSION: Intramedullary nail of the LEFT tibia. Electronically Signed   By: Norva Pavlov M.D.   On: 05/04/2018 10:11   Dg Ankle 2 Views Right  Result Date: 05/05/2018 CLINICAL DATA:  Post fall on 04/2015 with persistent foot and ankle pain and swelling. EXAM: RIGHT ANKLE - 2 VIEW COMPARISON:  None. FINDINGS: There is a ill-defined linear ossicle adjacent to the anterolateral aspect of the calcaneus worrisome for an additional avulsion fracture. No additional fractures identified. Peripherally corticated ossicle adjacent to the cranial aspect of the talar beak likely represents sequela of remote avulsive injury. Joint spaces appear preserved though a dedicated mortise radiograph was not provided. Moderate-sized plantar calcaneal spur. Expected soft tissue swelling about the lateral hind foot. No radiopaque foreign body. IMPRESSION: 1. Ill-defined linear ossicle adjacent to the anterolateral aspect of the calcaneus worrisome for an additional  avulsion fracture. Correlation for point tenderness at this location is recommended. 2. Moderate-sized plantar calcaneal spur. Electronically Signed   By: Simonne Come M.D.   On: 05/05/2018 12:30   Dg Chest Port 1 View  Result Date: 05/06/2018 CLINICAL DATA:  Hypoxia. EXAM: PORTABLE CHEST 1 VIEW COMPARISON:  Radiograph 05/03/2018.  Chest CT 04/13/2018 FINDINGS: Slightly improved aeration compared to prior exam. The cardiomediastinal contours are normal. Linear right basilar opacity may be atelectasis or scarring. Pulmonary vasculature is normal. No consolidation, pleural effusion, or pneumothorax. No acute osseous abnormalities are seen. IMPRESSION: Right lung base atelectasis or scarring. Slightly improved aeration compared to prior exam. No acute findings to explain hypoxia. Electronically Signed   By: Narda Rutherford M.D.   On: 05/06/2018 21:39   Dg Foot 2 Views Right  Result Date: 05/05/2018 CLINICAL DATA:  Post fall on 09/26 with persistent pain and bruising. EXAM: RIGHT FOOT - 2 VIEW COMPARISON:  07/17/2017  FINDINGS: There are minimally displaced slightly comminuted fractures involving the distal aspects the fourth and fifth metatarsals with angulation, apex lateral. No definitive intra-articular extension. Additionally, there is a ill-defined linear ossicle adjacent to the anterolateral aspect of the calcaneus worrisome for an additional avulsion fracture. No additional fractures identified. Peripherally corticated ossicle adjacent to the cranial aspect of the talar beak likely represents sequela of remote avulsive injury. Mild degenerative change of the first MTP joint with joint space loss, articular surface irregularity and osteophytosis. No significant hallux valgus deformity. No erosions. Moderate-sized plantar calcaneal spur. Expected soft tissue swelling about the forefoot and lateral hind foot. No radiopaque foreign body. IMPRESSION: 1. Minimally displaced fractures involving the distal aspect  of the fourth and fifth metatarsal. 2. Ill-defined linear ossicle adjacent to the anterolateral aspect of the calcaneus worrisome for an additional avulsion fracture. Correlation for point tenderness at this location is recommended. Electronically Signed   By: Simonne Come M.D.   On: 05/05/2018 12:28   Dg C-arm 1-60 Min  Result Date: 05/04/2018 CLINICAL DATA:  INTRAMEDULLARY (IM) NAIL TIBIAL - LEFT EXAM: LEFT TIBIA AND FIBULA - 2 VIEW; DG C-ARM 61-120 MIN COMPARISON:  None. FINDINGS: Images are performed of the proximal and distal LEFT tibia, demonstrating intramedullary nail fixation with proximal and distal cortical screws. A minimally displaced oblique fracture of the proximal LEFT fibula is present. IMPRESSION: Intramedullary nail of the LEFT tibia. Electronically Signed   By: Norva Pavlov M.D.   On: 05/04/2018 10:11   Dg C-arm 1-60 Min  Result Date: 05/04/2018 CLINICAL DATA:  INTRAMEDULLARY (IM) NAIL TIBIAL - LEFT EXAM: LEFT TIBIA AND FIBULA - 2 VIEW; DG C-ARM 61-120 MIN COMPARISON:  None. FINDINGS: Images are performed of the proximal and distal LEFT tibia, demonstrating intramedullary nail fixation with proximal and distal cortical screws. A minimally displaced oblique fracture of the proximal LEFT fibula is present. IMPRESSION: Intramedullary nail of the LEFT tibia. Electronically Signed   By: Norva Pavlov M.D.   On: 05/04/2018 10:11     TODAY-DAY OF DISCHARGE:  Subjective:   Rhonda Martinez today has no headache,no chest abdominal pain,no new weakness tingling or numbness, feels much better wants to go home today.   Objective:   Blood pressure 95/68, pulse 79, temperature 98.4 F (36.9 C), temperature source Oral, resp. rate 18, height 5' 5.98" (1.676 m), weight 95.3 kg, SpO2 97 %.  Intake/Output Summary (Last 24 hours) at 05/07/2018 1034 Last data filed at 05/07/2018 0900 Gross per 24 hour  Intake 240 ml  Output 2250 ml  Net -2010 ml   Filed Weights   05/04/18 1102  Weight:  95.3 kg    Exam: Awake Alert, Oriented *3, No new F.N deficits, Normal affect [place].AT,PERRAL Supple Neck,No JVD, No cervical lymphadenopathy appriciated.  Symmetrical Chest wall movement, Good air movement bilaterally, CTAB RRR,No Gallops,Rubs or new Murmurs, No Parasternal Heave +ve B.Sounds, Abd Soft, Non tender, No organomegaly appriciated, No rebound -guarding or rigidity. No Cyanosis, Clubbing or edema, No new Rash or bruise   PERTINENT RADIOLOGIC STUDIES: Dg Tibia/fibula Left  Result Date: 05/04/2018 CLINICAL DATA:  INTRAMEDULLARY (IM) NAIL TIBIAL - LEFT EXAM: LEFT TIBIA AND FIBULA - 2 VIEW; DG C-ARM 61-120 MIN COMPARISON:  None. FINDINGS: Images are performed of the proximal and distal LEFT tibia, demonstrating intramedullary nail fixation with proximal and distal cortical screws. A minimally displaced oblique fracture of the proximal LEFT fibula is present. IMPRESSION: Intramedullary nail of the LEFT tibia. Electronically Signed   By: Lanora Manis  Manson Passey M.D.   On: 05/04/2018 10:11   Dg Ankle 2 Views Right  Result Date: 05/05/2018 CLINICAL DATA:  Post fall on 04/2015 with persistent foot and ankle pain and swelling. EXAM: RIGHT ANKLE - 2 VIEW COMPARISON:  None. FINDINGS: There is a ill-defined linear ossicle adjacent to the anterolateral aspect of the calcaneus worrisome for an additional avulsion fracture. No additional fractures identified. Peripherally corticated ossicle adjacent to the cranial aspect of the talar beak likely represents sequela of remote avulsive injury. Joint spaces appear preserved though a dedicated mortise radiograph was not provided. Moderate-sized plantar calcaneal spur. Expected soft tissue swelling about the lateral hind foot. No radiopaque foreign body. IMPRESSION: 1. Ill-defined linear ossicle adjacent to the anterolateral aspect of the calcaneus worrisome for an additional avulsion fracture. Correlation for point tenderness at this location is recommended. 2.  Moderate-sized plantar calcaneal spur. Electronically Signed   By: Simonne Come M.D.   On: 05/05/2018 12:30   Dg Chest Port 1 View  Result Date: 05/06/2018 CLINICAL DATA:  Hypoxia. EXAM: PORTABLE CHEST 1 VIEW COMPARISON:  Radiograph 05/03/2018.  Chest CT 04/13/2018 FINDINGS: Slightly improved aeration compared to prior exam. The cardiomediastinal contours are normal. Linear right basilar opacity may be atelectasis or scarring. Pulmonary vasculature is normal. No consolidation, pleural effusion, or pneumothorax. No acute osseous abnormalities are seen. IMPRESSION: Right lung base atelectasis or scarring. Slightly improved aeration compared to prior exam. No acute findings to explain hypoxia. Electronically Signed   By: Narda Rutherford M.D.   On: 05/06/2018 21:39   Dg Foot 2 Views Right  Result Date: 05/05/2018 CLINICAL DATA:  Post fall on 09/26 with persistent pain and bruising. EXAM: RIGHT FOOT - 2 VIEW COMPARISON:  07/17/2017 FINDINGS: There are minimally displaced slightly comminuted fractures involving the distal aspects the fourth and fifth metatarsals with angulation, apex lateral. No definitive intra-articular extension. Additionally, there is a ill-defined linear ossicle adjacent to the anterolateral aspect of the calcaneus worrisome for an additional avulsion fracture. No additional fractures identified. Peripherally corticated ossicle adjacent to the cranial aspect of the talar beak likely represents sequela of remote avulsive injury. Mild degenerative change of the first MTP joint with joint space loss, articular surface irregularity and osteophytosis. No significant hallux valgus deformity. No erosions. Moderate-sized plantar calcaneal spur. Expected soft tissue swelling about the forefoot and lateral hind foot. No radiopaque foreign body. IMPRESSION: 1. Minimally displaced fractures involving the distal aspect of the fourth and fifth metatarsal. 2. Ill-defined linear ossicle adjacent to the  anterolateral aspect of the calcaneus worrisome for an additional avulsion fracture. Correlation for point tenderness at this location is recommended. Electronically Signed   By: Simonne Come M.D.   On: 05/05/2018 12:28   Dg C-arm 1-60 Min  Result Date: 05/04/2018 CLINICAL DATA:  INTRAMEDULLARY (IM) NAIL TIBIAL - LEFT EXAM: LEFT TIBIA AND FIBULA - 2 VIEW; DG C-ARM 61-120 MIN COMPARISON:  None. FINDINGS: Images are performed of the proximal and distal LEFT tibia, demonstrating intramedullary nail fixation with proximal and distal cortical screws. A minimally displaced oblique fracture of the proximal LEFT fibula is present. IMPRESSION: Intramedullary nail of the LEFT tibia. Electronically Signed   By: Norva Pavlov M.D.   On: 05/04/2018 10:11   Dg C-arm 1-60 Min  Result Date: 05/04/2018 CLINICAL DATA:  INTRAMEDULLARY (IM) NAIL TIBIAL - LEFT EXAM: LEFT TIBIA AND FIBULA - 2 VIEW; DG C-ARM 61-120 MIN COMPARISON:  None. FINDINGS: Images are performed of the proximal and distal LEFT tibia, demonstrating intramedullary nail  fixation with proximal and distal cortical screws. A minimally displaced oblique fracture of the proximal LEFT fibula is present. IMPRESSION: Intramedullary nail of the LEFT tibia. Electronically Signed   By: Norva Pavlov M.D.   On: 05/04/2018 10:11     PERTINENT LAB RESULTS: CBC: Recent Labs    05/06/18 0525 05/07/18 0448  WBC 9.2 10.0  HGB 9.6* 9.3*  HCT 30.0* 29.2*  PLT 164 175   CMET CMP     Component Value Date/Time   NA 136 05/05/2018 0625   K 4.5 05/05/2018 0625   CL 102 05/05/2018 0625   CO2 27 05/05/2018 0625   GLUCOSE 134 (H) 05/05/2018 0625   BUN 9 05/05/2018 0625   CREATININE 0.85 05/05/2018 0625   CALCIUM 8.7 (L) 05/05/2018 0625   GFRNONAA >60 05/05/2018 0625   GFRAA >60 05/05/2018 0625    GFR Estimated Creatinine Clearance: 78.8 mL/min (by C-G formula based on SCr of 0.85 mg/dL). No results for input(s): LIPASE, AMYLASE in the last 72  hours. No results for input(s): CKTOTAL, CKMB, CKMBINDEX, TROPONINI in the last 72 hours. Invalid input(s): POCBNP No results for input(s): DDIMER in the last 72 hours. No results for input(s): HGBA1C in the last 72 hours. No results for input(s): CHOL, HDL, LDLCALC, TRIG, CHOLHDL, LDLDIRECT in the last 72 hours. No results for input(s): TSH, T4TOTAL, T3FREE, THYROIDAB in the last 72 hours.  Invalid input(s): FREET3 No results for input(s): VITAMINB12, FOLATE, FERRITIN, TIBC, IRON, RETICCTPCT in the last 72 hours. Coags: No results for input(s): INR in the last 72 hours.  Invalid input(s): PT Microbiology: Recent Results (from the past 240 hour(s))  Surgical pcr screen     Status: None   Collection Time: 05/03/18 11:25 PM  Result Value Ref Range Status   MRSA, PCR NEGATIVE NEGATIVE Final   Staphylococcus aureus NEGATIVE NEGATIVE Final    Comment: (NOTE) The Xpert SA Assay (FDA approved for NASAL specimens in patients 71 years of age and older), is one component of a comprehensive surveillance program. It is not intended to diagnose infection nor to guide or monitor treatment. Performed at [facility], [address] [place], [place] [address]     FURTHER DISCHARGE INSTRUCTIONS:  Get Medicines reviewed and adjusted: Please take all your medications with you for your next visit with your Primary MD  Laboratory/radiological data: Please request your Primary MD to go over all hospital tests and procedure/radiological results at the follow up, please ask your Primary MD to get all Hospital records sent to his/her office.  In some cases, they will be blood work, cultures and biopsy results pending at the time of your discharge. Please request that your primary care M.D. goes through all the records of your hospital data and follows up on these results.  Also Note the following: If you experience worsening of your admission symptoms, develop shortness of breath, life  threatening emergency, suicidal or homicidal thoughts you must seek medical attention immediately by calling 911 or calling your MD immediately  if symptoms less severe.  You must read complete instructions/literature along with all the possible adverse reactions/side effects for all the Medicines you take and that have been prescribed to you. Take any new Medicines after you have completely understood and accpet all the possible adverse reactions/side effects.   Do not drive when taking Pain medications or sleeping medications (Benzodaizepines)  Do not take more than prescribed Pain, Sleep and Anxiety Medications. It is not advisable to combine anxiety,sleep  and pain medications without talking with your primary care practitioner  Special Instructions: If you have smoked or chewed Tobacco  in the last 2 yrs please stop smoking, stop any regular Alcohol  and or any Recreational drug use.  Wear Seat belts while driving.  Please note: You were cared for by a hospitalist during your hospital stay. Once you are discharged, your primary care physician will handle any further medical issues. Please note that NO REFILLS for any discharge medications will be authorized once you are discharged, as it is imperative that you return to your primary care physician (or establish a relationship with a primary care physician if you do not have one) for your post hospital discharge needs so that they can reassess your need for medications and monitor your lab values.  Total Time spent coordinating discharge including counseling, education and face to face time equals 35 min  Signed: Shanker Ghimire 05/07/2018 10:34 AM

## 2018-05-07 NOTE — Progress Notes (Signed)
Patient voided  post foley catheter removal.

## 2018-05-07 NOTE — Clinical Social Work Placement (Signed)
   CLINICAL SOCIAL WORK PLACEMENT  NOTE  Date:  05/07/2018  Patient Details  Name: Rhonda Martinez MRN: [id number] Date of Birth: 1956-03-06  Clinical Social Work is seeking post-discharge placement for this patient at the Skilled  Nursing Facility level of care (*CSW will initial, date and re-position this form in  chart as items are completed):  Yes   Patient/family provided with [facility] Clinical Social Work Department's list of facilities offering this level of care within the geographic area requested by the patient (or if unable, by the patient's family).  Yes   Patient/family informed of their freedom to choose among providers that offer the needed level of care, that participate in Medicare, Medicaid or managed care program needed by the patient, have an available bed and are willing to accept the patient.      Patient/family informed of [facility]'s ownership interest in [facility] and [facility], as well as of the fact that they are under no obligation to receive care at these facilities.  PASRR submitted to EDS on       PASRR number received on 05/04/18     Existing PASRR number confirmed on       FL2 transmitted to all facilities in geographic area requested by pt/family on 05/04/18     FL2 transmitted to all facilities within larger geographic area on 05/04/18     Patient informed that his/her managed care company has contracts with or will negotiate with certain facilities, including the following:        Yes   Patient/family informed of bed offers received.  Patient [facility]     Physician recommends and patient chooses bed at [facility]    Patient to be transferred to [facility] on 05/07/18.  Patient to be transferred to facility by [facility]     Patient family notified on   of transfer.  Name of family member notified:  No family identified to notify     PHYSICIAN       Additional  Comment:    _______________________________________________ Gildardo Griffes, LCSW 05/07/2018, 4:38 PM

## 2018-05-07 NOTE — Progress Notes (Signed)
Pt discharged via EMS transport to [facility]

## 2018-05-07 NOTE — Progress Notes (Signed)
Physical Therapy Treatment Patient Details Name: Rhonda Martinez MRN: [id number] DOB: Dec 18, 1955 Today's Date: 05/07/2018    History of Present Illness Patient fell in her bathroom and fractured her left tib/fib. She also has a right metatarsel fx. She had an ORIF on 05/04/2018. PMH: COPD     PT Comments    Pt is eager to mobilize to bathroom and presents with decreased assistance moving from bed to bed side commode.  Pt able to manage 2 hop steps on RLE from [facility] to recliner chair.  She is performing these tasks with minimal assistance.  She continues to benefit from skilled rehab at SNF to improve strength and function before returning home.  Pt seated in recliner post session with L knee slightly bent.  Educated on the importance of knee flexion to improve ROM.    Follow Up Recommendations  SNF     Equipment Recommendations  Rolling walker with 5" wheels    Recommendations for Other Services Rehab consult     Precautions / Restrictions Precautions Precautions: Fall Required Braces or Orthoses: Other Brace/Splint Other Brace/Splint: L CAM boot and R post op shoe.   Restrictions Weight Bearing Restrictions: Yes RLE Weight Bearing: Weight bearing as tolerated(in post op shoe) LLE Weight Bearing: Touchdown weight bearing(in CAM boot)    Mobility  Bed Mobility Overal bed mobility: Needs Assistance Bed Mobility: Supine to Sit     Supine to sit: Min assist     General bed mobility comments: Pt required assistance to manage LLE to edge of bed and to floor.  She is able to manage R LE and upper trunk to achieve sitting.  Heavy reliance of bed rail for support.  Slow moving and required increased time.    Transfers Overall transfer level: Needs assistance Equipment used: Rolling walker (2 wheeled) Transfers: Sit to/from [employer] Sit to Stand: Min assist Stand pivot transfers: Min assist       General transfer comment: Pt required min assistance to boost into  standing,  Cues for hand and foot placement for safety and to improve ease.  Pt in standing is able to pivot of R foor to move from bed to Bed side commode.  Pt did take x2 hop steps from [facility] to recliner chair.    Ambulation/Gait                 Stairs             Wheelchair Mobility    Modified Rankin (Stroke Patients Only)       Balance Overall balance assessment: Needs assistance Sitting-balance support: Bilateral upper extremity supported Sitting balance-Leahy Scale: Fair     Standing balance support: Bilateral upper extremity supported Standing balance-Leahy Scale: Poor                              Cognition Arousal/Alertness: Awake/alert Behavior During Therapy: WFL for tasks assessed/performed Overall Cognitive Status: Within Functional Limits for tasks assessed                                        Exercises      General Comments        Pertinent Vitals/Pain Pain Assessment: Faces Faces Pain Scale: Hurts little more Pain Location: left lower leg  Pain Descriptors / Indicators: Aching Pain Intervention(s): Monitored during session;Repositioned  Home Living                      Prior Function            PT Goals (current goals can now be found in the care plan section) Acute Rehab PT Goals Patient Stated Goal: to get out of bed and start moving better.  Potential to Achieve Goals: Good Progress towards PT goals: Progressing toward goals    Frequency    Min 4X/week      PT Plan Current plan remains appropriate    Co-evaluation              AM-PAC PT "6 Clicks" Daily Activity  Outcome Measure  Difficulty turning over in bed (including adjusting bedclothes, sheets and blankets)?: Unable Difficulty moving from lying on back to sitting on the side of the bed? : Unable Difficulty sitting down on and standing up from a chair with arms (e.g., wheelchair, bedside commode, etc,.)?:  Unable Help needed moving to and from a bed to chair (including a wheelchair)?: A Little Help needed walking in hospital room?: A Little Help needed climbing 3-5 steps with a railing? : A Little 6 Click Score: 12    End of Session Equipment Utilized During Treatment: Gait belt Activity Tolerance: Patient limited by pain Patient left: in chair;with call bell/phone within reach Nurse Communication: Mobility status PT Visit Diagnosis: Unsteadiness on feet (R26.81);Muscle weakness (generalized) (M62.81);Difficulty in walking, not elsewhere classified (R26.2)     Time: 4098-1191 PT Time Calculation (min) (ACUTE ONLY): 17 min  Charges:  $Therapeutic Activity: 8-22 mins                     Joycelyn Rua, PTA Acute Rehabilitation Services Pager [phone] Office [phone]     Kaci Dillie Artis Delay 05/07/2018, 1:02 PM

## 2018-05-07 NOTE — Plan of Care (Signed)

## 2018-05-07 NOTE — Progress Notes (Signed)
Subjective: 3 Days Post-Op Procedure(s) (LRB): INTRAMEDULLARY (IM) NAIL TIBIAL (Left)  She has had improvement in pain.  She is able to mobilize with therapy.  She is maintaining touchdown weightbearing on the left.  Right foot fractures identified when trying to mobilize.  She is been able to put her weight on this foot in a postop shoe.  She has been requiring oxygen and desats when off oxygen.  Patient states she would like to go home rather than to a skilled nursing facility.  She denies current shortness of breath or fever.  Objective: Vital signs in last 24 hours: Temp:  [98 F (36.7 C)-98.4 F (36.9 C)] 98.4 F (36.9 C) (09/30 0556) Pulse Rate:  [79-88] 79 (09/30 0556) Resp:  [18] 18 (09/29 1534) BP: (95-113)/(59-69) 95/68 (09/30 0556) SpO2:  [92 %-97 %] 97 % (09/30 0556)   Recent Labs    05/05/18 0625 05/06/18 0525 05/07/18 0448  HGB 10.5* 9.6* 9.3*    Physical exam: Awake and alert.  Appropriate.  Oriented. Right lower extremity in postoperative shoe.  Mild swelling.  Some tenderness palpation laterally about the foot.  Foot is warm and well-perfused Left lower examinee and walking boot.  Dressing in place.  No drainage.  Wiggles toes.  Endorses silt over toes.   Assessment/Plan: 3 Days Post-Op Procedure(s) (LRB): INTRAMEDULLARY (IM) NAIL TIBIAL (Left)  Overall Lillyanne is doing well.  She is had improvement in her pain.  She is been mobilizing well.  She would like to go home and will defer disposition to physical therapy and medical team.  From an orthopedic standpoint she is acceptable for discharge.  She will follow-up with me in 2 weeks.  Continue physical therapy. Weightbearing as tolerated right lower extremity.  Touchdown weightbearing left lower extremity. Lovenox for DVT prophylaxis Pain medication, Lovenox, ibuprofen, Robaxin put in discharge orders for discharge. She will follow-up with me in 2 weeks. Do not change dressing on the left lower  extremity.  Please call [phone] with questions.    Terance Hart 05/07/2018, 8:42 AM

## 2018-05-07 NOTE — Plan of Care (Signed)

## 2018-05-07 NOTE — Care Management Important Message (Signed)
Important Message  Patient Details  Name: Rhonda Martinez MRN: [id number] Date of Birth: 12-16-1955   Medicare Important Message Given:  Yes    Jemia Fata Stefan Church 05/07/2018, 4:18 PM

## 2018-05-07 NOTE — Progress Notes (Signed)
Patient will DC to: [facility] Anticipated DC date: 05/07/18 Family notified: none- patient has notified Transport by: Sharin Mons  Per MD patient ready for DC to [facility]. RN, patient, patient's family, and facility notified of DC. Discharge Summary sent to facility. RN given number for report [phone] Room 215. DC packet on chart. Ambulance transport requested for patient.  CSW signing off.  [place], [place] [phone]

## 2020-01-10 IMAGING — DX DG ANKLE 2V *R*
1 series · 2 of 2 positions shown · non-contrast
Comparison: None.

CLINICAL DATA: Post fall on [DATE] with persistent foot and ankle
pain and swelling.

EXAM:
RIGHT ANKLE - 2 VIEW

[Series 1: ankle · 0.14mm/px · 2 of 2 slices shown]
[im 1/2]
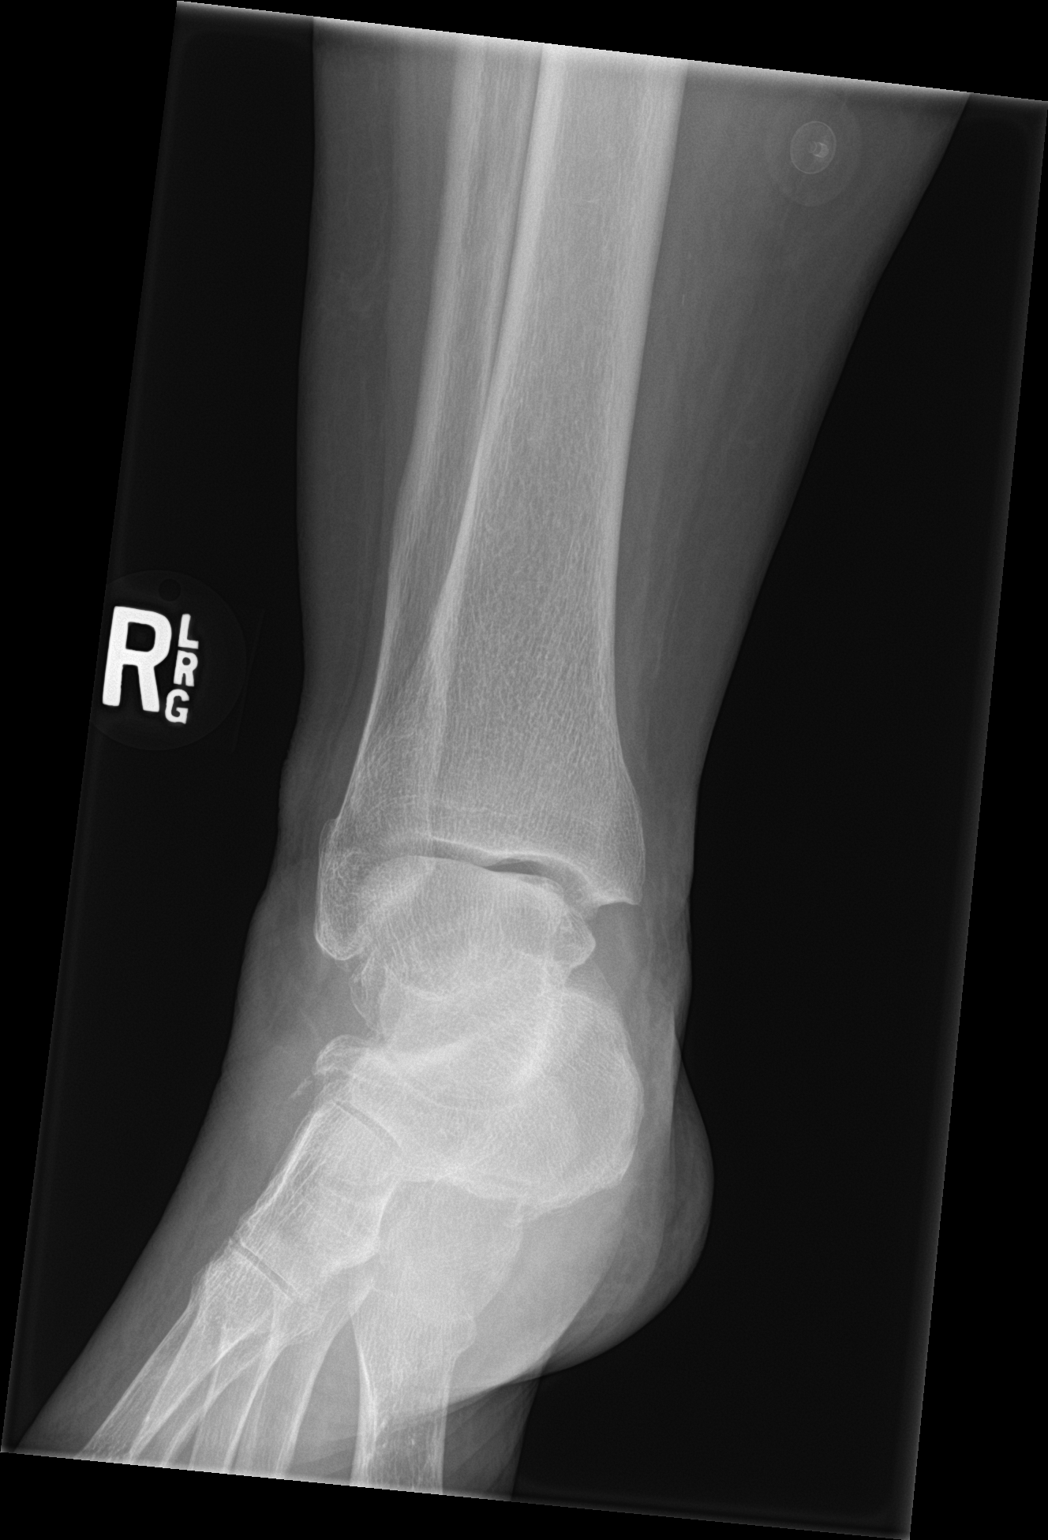
[im 2/2]
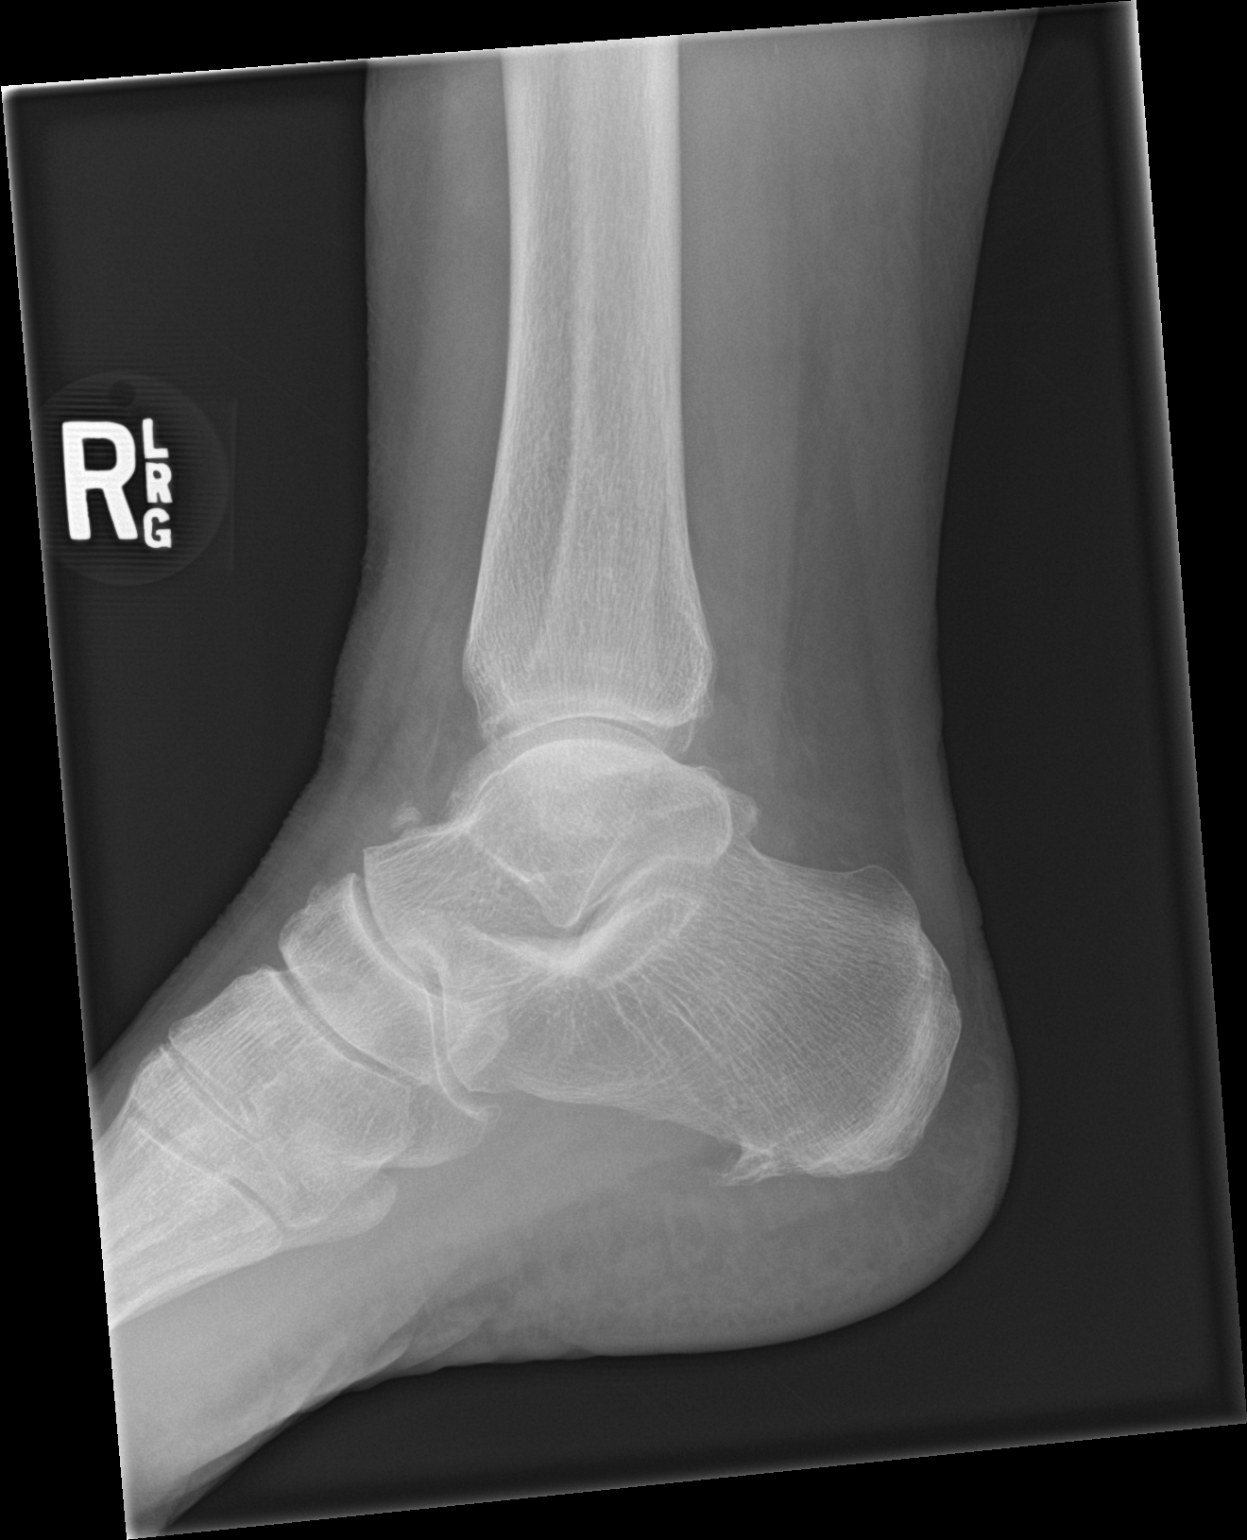

[2 of 2 positions shown; findings below may reference images not displayed]

FINDINGS: There is a ill-defined linear ossicle adjacent to the anterolateral
aspect of the calcaneus worrisome for an additional avulsion
fracture.

No additional fractures identified. Peripherally corticated ossicle
adjacent to the cranial aspect of the talar beak likely represents
sequela of remote avulsive injury.

Joint spaces appear preserved though a dedicated mortise radiograph
was not provided. Moderate-sized plantar calcaneal spur.

Expected soft tissue swelling about the lateral hind foot. No
radiopaque foreign body.
IMPRESSION: 1. Ill-defined linear ossicle adjacent to the anterolateral aspect
of the calcaneus worrisome for an additional avulsion fracture.
Correlation for point tenderness at this location is recommended.
2. Moderate-sized plantar calcaneal spur.

## 2020-01-11 IMAGING — DX DG CHEST 1V PORT
1 series · 1 of 1 positions shown · non-contrast
Comparison: Radiograph 05/03/2018.  Chest CT 04/13/2018

CLINICAL DATA: Hypoxia.

EXAM:
PORTABLE CHEST 1 VIEW

[chest ap]
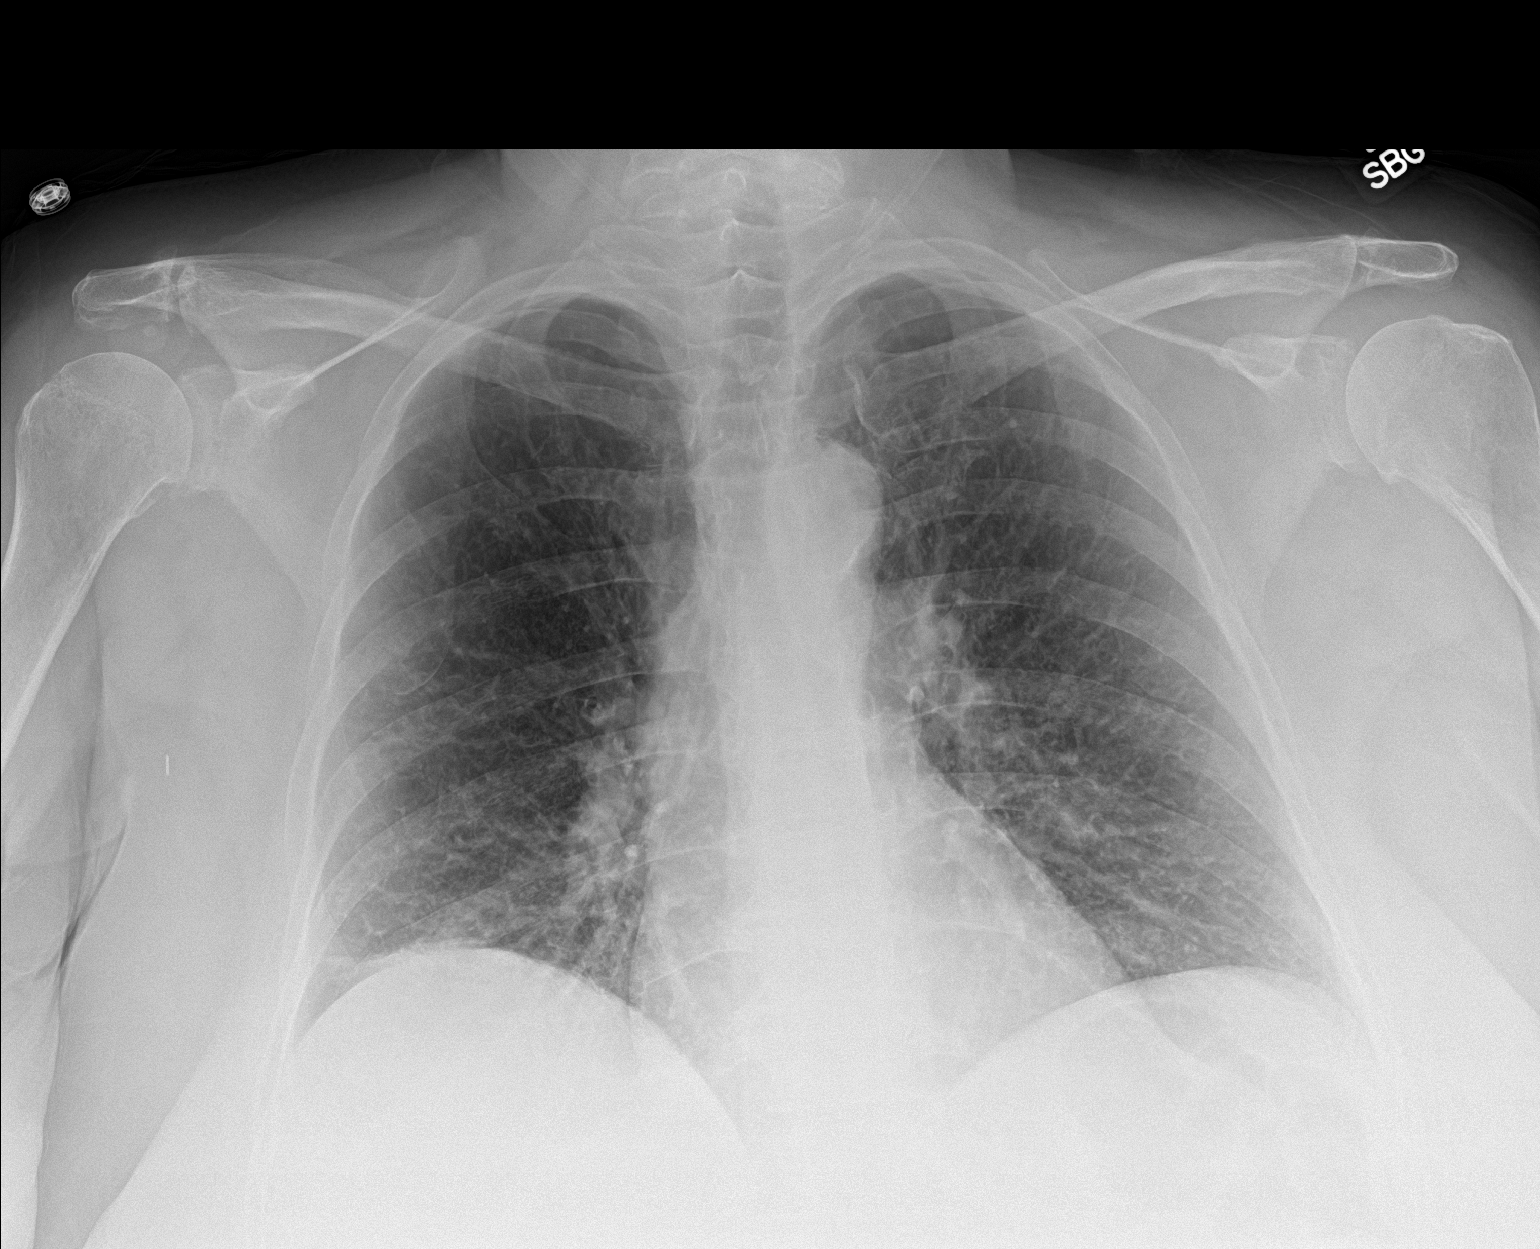

[1 of 1 positions shown; findings below may reference images not displayed]

FINDINGS: Slightly improved aeration compared to prior exam. The
cardiomediastinal contours are normal. Linear right basilar opacity
may be atelectasis or scarring. Pulmonary vasculature is normal. No
consolidation, pleural effusion, or pneumothorax. No acute osseous
abnormalities are seen.
IMPRESSION: Right lung base atelectasis or scarring. Slightly improved aeration
compared to prior exam. No acute findings to explain hypoxia.

## 2024-07-08 DEATH — deceased
